# Patient Record
Sex: Male | Born: 2000
Health system: Southern US, Community
[De-identification: ages and names within clinical notes are randomized; demographics above are authoritative.]

## PROBLEM LIST (undated history)

## (undated) DIAGNOSIS — F411 Generalized anxiety disorder: Secondary | ICD-10-CM

## (undated) DIAGNOSIS — F4010 Social phobia, unspecified: Secondary | ICD-10-CM

## (undated) DIAGNOSIS — F419 Anxiety disorder, unspecified: Secondary | ICD-10-CM

## (undated) DIAGNOSIS — F329 Major depressive disorder, single episode, unspecified: Secondary | ICD-10-CM

## (undated) DIAGNOSIS — F32A Depression, unspecified: Secondary | ICD-10-CM

## (undated) DIAGNOSIS — S42309A Unspecified fracture of shaft of humerus, unspecified arm, initial encounter for closed fracture: Secondary | ICD-10-CM

## (undated) DIAGNOSIS — R45851 Suicidal ideations: Secondary | ICD-10-CM

## (undated) HISTORY — PX: APPENDECTOMY: SHX54

## (undated) HISTORY — DX: Unspecified fracture of shaft of humerus, unspecified arm, initial encounter for closed fracture: S42.309A

---
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
## 2020-01-01 ENCOUNTER — Ambulatory Visit: Payer: BC Managed Care – PPO | Attending: Family Medicine

## 2020-01-01 ENCOUNTER — Other Ambulatory Visit: Payer: Self-pay

## 2020-01-01 DIAGNOSIS — R252 Cramp and spasm: Secondary | ICD-10-CM | POA: Diagnosis present

## 2020-01-01 DIAGNOSIS — M6281 Muscle weakness (generalized): Secondary | ICD-10-CM | POA: Diagnosis present

## 2020-01-01 DIAGNOSIS — M25561 Pain in right knee: Secondary | ICD-10-CM | POA: Insufficient documentation

## 2020-01-01 DIAGNOSIS — M25562 Pain in left knee: Secondary | ICD-10-CM | POA: Diagnosis present

## 2020-01-01 DIAGNOSIS — M25511 Pain in right shoulder: Secondary | ICD-10-CM

## 2020-01-01 DIAGNOSIS — R293 Abnormal posture: Secondary | ICD-10-CM | POA: Diagnosis present

## 2020-01-01 DIAGNOSIS — G8929 Other chronic pain: Secondary | ICD-10-CM

## 2020-01-01 NOTE — Therapy (Signed)
[facility] [address], [address] [place], [place], [address] Phone: [phone]   Fax:  [phone]  Physical Therapy Treatment  Patient Details  Name: Steven Mcguire MRN: [id number] Date of Birth: 09/07/2000 Referring Provider (PT): Lavada Mesi, MD   Encounter Date: 01/01/2020   PT End of Session - 01/01/20 1013    Visit Number 5    Date for PT Re-Evaluation 01/29/20    Authorization Type BCBS    PT Start Time 0931    PT Stop Time 1012    PT Time Calculation (min) 41 min    Activity Tolerance Patient tolerated treatment well;No increased pain    Behavior During Therapy WFL for tasks assessed/performed           Past Medical History:  Diagnosis Date  . Anxiety   . Anxiety, generalized   . Broken arm    right  . Depression   . Social anxiety disorder   . Suicidal ideation     Past Surgical History:  Procedure Laterality Date  . APPENDECTOMY     5th grade  . INGUINAL HERNIA REPAIR  2003  . WISDOM TOOTH EXTRACTION Bilateral 08/2019   all 4 wisdom teeth were extracted    There were no vitals filed for this visit.   Subjective Assessment - 01/01/20 0937    Subjective I was on vacation.  No change in knee pain.  I have been doing hip exercises with resisted bands.  Rt shoulder feels 30% better.    Currently in Pain? Yes    Pain Score 4     Pain Location Knee    Pain Orientation Right;Left    Pain Descriptors / Indicators Sore    Pain Type Chronic pain    Pain Onset More than a month ago    Pain Frequency Intermittent    Aggravating Factors  standing, work on feet    Pain Relieving Factors sitting down,rest    Pain Score 0    Pain Location Shoulder                             [facility] Adult PT Treatment/Exercise - 01/01/20 0001      Knee/Hip Exercises: Standing   SLS on blue pod with overhead ball bounce on wall 2x10 to incorporate overhead use    Rebounder weight shifting 3 ways  without UE support and increased speed x 1 minute 3 ways.  Tandem stance 2x30 seconds bil each    Walking with Sports Cord 4 ways x 10 each: 20#       Knee/Hip Exercises: Supine   Other Supine Knee/Hip Exercises green clamshell pulses x 20 outer range tension      Shoulder Exercises: Standing   Other Standing Exercises circles on wall for proximal stabilizaton 2x10 CW/CCW each      Iontophoresis   Type of Iontophoresis Dexamethasone    Location Rt proximal biceps tendon    Dose 1.0 cc, #4    Time 4 hour                    PT Short Term Goals - 01/01/20 2025      PT SHORT TERM GOAL #1   Title be independent in initial HEP    Status Achieved      PT SHORT TERM GOAL #2   Title report a 40% reduction in Rt shoulder pain with reaching overhead and behind  his back    Baseline 30% better    Time 4    Period Weeks    Status On-going      PT SHORT TERM GOAL #3   Title report a 30% reduction in knee pain with standing and squatting for work tasks    Baseline no change reported    Time 4    Period Weeks    Status On-going             PT Long Term Goals - 12/04/19 0926      PT LONG TERM GOAL #1   Title The patient will be independent in safe self progression of HEP    Time 8    Period Weeks    Status New    Target Date 01/29/20      PT LONG TERM GOAL #2   Title reduce FOTO to < to = to 21% limitation    Baseline --    Time 8    Period Weeks    Status New    Target Date 01/29/20      PT LONG TERM GOAL #3   Title report a 75% reduction in Rt UE pain with reaching overhead and behing the back    Baseline --    Time 8    Period Weeks    Status New    Target Date 01/29/20      PT LONG TERM GOAL #4   Title report a 75% reduction in bil knee pain with standing and squatting for work tasks    Baseline --    Time 8    Period Weeks    Status New    Target Date 01/29/20      PT LONG TERM GOAL #5   Title perform regular weight training with the Rt UE  without limitation due to pain    Baseline --    Time 8    Period Weeks    Status New    Target Date 01/29/20                 Plan - 01/01/20 0948    Clinical Impression Statement Pt with lapse in treatment since 12/18/19 due to vacation.  Pt reports 30% improvement in Rt shoulder pain since the start of care and no change in bil knee pain.  Pt has been performing bil knee and hip strength at home consistently.  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  Pt required verbal cues for hip alignment with difficulty with neutral alignment due to hip weakness.   He will continue to benefit from skilled progression to improve strength and mechanis.  PT applied ionto patch to Rt bicep tendon as it continues to be tender and is exacerbated carrying trays on shoulder at work.    PT Treatment/Interventions ADLs/Self Care Home Management;Cryotherapy;Electrical Stimulation;Ultrasound;Moist Heat;Iontophoresis 4mg /ml Dexamethasone;Therapeutic activities;Therapeutic exercise;Neuromuscular re-education;Manual techniques;Patient/family education;Dry needling;Taping;Joint Manipulations;Spinal Manipulations    PT Next Visit Plan f/u on updates to LE HEP, knee and glut strength, ionto Rt shoulder, proximal shoulder stab    PT Home Exercise Plan Access Code: (shoulder program), new program for knee  Access Code: [email or web address]    Consulted and Agree with Plan of Care Patient           Patient will benefit from skilled therapeutic intervention in order to improve the following deficits and impairments:  Decreased range of motion, Increased fascial restricitons, Impaired UE functional use, Pain, Impaired flexibility, Increased muscle spasms, Postural dysfunction, Decreased activity tolerance, Decreased endurance  Visit Diagnosis: Muscle weakness (generalized)  Chronic pain of left knee  Chronic pain  of right knee  Acute pain of right shoulder     Problem List Patient Active Problem List   Diagnosis Date Noted  . Suicidal ideation 08/10/2015  . OCD (obsessive compulsive disorder) 05/07/2015  . Neurotic depression 05/07/2015  . Major depressive disorder, recurrent, severe without psychotic features ([facility])   . MDD (major depressive disorder), recurrent episode, severe ([facility]) 12/24/2014  . School avoidance 09/12/2014  . Abdominal pain in pediatric patient 07/22/2014  . Generalized anxiety disorder 10/05/2013  . Acne 10/05/2013  . Social anxiety disorder 05/06/2013  . Acute appendicitis 11/27/2011     Lorrene Reid, PT 01/01/20 10:14 AM  [facility] [address], [address] [place], [place], [address] Phone: [phone]   Fax:  [phone]  Name: Steven Mcguire MRN: [id number] Date of Birth: 04/06/2001

## 2020-01-06 ENCOUNTER — Ambulatory Visit: Payer: BC Managed Care – PPO | Admitting: Physical Therapy

## 2020-01-06 ENCOUNTER — Other Ambulatory Visit: Payer: Self-pay

## 2020-01-06 ENCOUNTER — Encounter: Payer: Self-pay | Admitting: Physical Therapy

## 2020-01-06 DIAGNOSIS — G8929 Other chronic pain: Secondary | ICD-10-CM

## 2020-01-06 DIAGNOSIS — M6281 Muscle weakness (generalized): Secondary | ICD-10-CM | POA: Diagnosis not present

## 2020-01-06 DIAGNOSIS — M25561 Pain in right knee: Secondary | ICD-10-CM

## 2020-01-06 DIAGNOSIS — M25511 Pain in right shoulder: Secondary | ICD-10-CM

## 2020-01-06 NOTE — Therapy (Signed)
[facility] [address] [place], [place], [address] Phone: [phone]   Fax:  [phone]  Physical Therapy Treatment  Patient Details  Name: Steven Mcguire MRN: [id number] Date of Birth: [birth year] Referring Provider (PT): Lavada Mesi, MD   Encounter Date: 01/06/2020   PT End of Session - 01/06/20 0929    Visit Number 6    Date for PT Re-Evaluation 01/29/20    Authorization Type BCBS    PT Start Time 0930    PT Stop Time 1012    PT Time Calculation (min) 42 min    Activity Tolerance Patient tolerated treatment well;No increased pain    Behavior During Therapy WFL for tasks assessed/performed           Past Medical History:  Diagnosis Date  . Anxiety   . Anxiety, generalized   . Broken arm    right  . Depression   . Social anxiety disorder   . Suicidal ideation     Past Surgical History:  Procedure Laterality Date  . APPENDECTOMY     5th grade  . INGUINAL HERNIA REPAIR  2003  . WISDOM TOOTH EXTRACTION Bilateral 08/2019   all 4 wisdom teeth were extracted    There were no vitals filed for this visit.   Subjective Assessment - 01/06/20 0930    Subjective Occassional twinges in Rt anterior shoulder.  Knees are about the same.    Pertinent History Lt pec pain- treated at this clinic.    Diagnostic tests none    Patient Stated Goals return to LE weight lifting, reduce knee pain, reduce Rt shoulder pain    Currently in Pain? 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 to simulate tray with scap isometrics and alignment practice, add deep squat to simulate lower tray with mirror for feedback on knee alignment      Knee/Hip Exercises: Programme researcher, broadcasting/film/video Right;Left;1 rep;60 seconds    Active Hamstring Stretch Limitations supine    Lobbyist Left;Right;1 rep;60 seconds    Lobbyist Limitations in SL      Knee/Hip Exercises: Machines for Strengthening   Cybex Leg Press 100lb bil LEs PT cued "train track" alignment for knees to avoid valgus      Knee/Hip Exercises: Standing   Functional Squat 15 reps    Functional Squat Limitations holding 10lb with blue hip abd band, bil LEs on foam black pad      Knee/Hip Exercises: Supine   Bridges Limitations hamstring curls bil feet on green ball in bridge 2x10      Shoulder Exercises: Prone   Other Prone Exercises bil I, T, Y 1x10 each      Shoulder Exercises: Standing   External Rotation Strengthening;Right;Theraband;15 reps    External Rotation  Limitations in 90 deg horiz abd    Other Standing Exercises low mat table plank UE walks down/back x 2 reps      Shoulder Exercises: ROM/Strengthening   UBE (Upper Arm Bike) L2 x 6 (3/3)min forward,backward with PT present to discuss progress     Lat Pull Limitations 35lb 2x15                    PT Short Term Goals - 01/01/20 0937      PT SHORT TERM GOAL #1   Title be independent in initial HEP    Status Achieved      PT SHORT TERM GOAL #2   Title report a 40% reduction in Rt shoulder pain with reaching overhead and behind his back    Baseline 30% better    Time 4    Period Weeks    Status On-going      PT SHORT TERM GOAL #3   Title report a 30% reduction in knee pain with standing and squatting for work tasks    Baseline no change reported    Time 4    Period Weeks    Status On-going             PT Long Term Goals - 12/04/19 0926      PT LONG TERM GOAL #1   Title The patient will  be independent in safe self progression of HEP    Time 8    Period Weeks    Status New    Target Date 01/29/20      PT LONG TERM GOAL #2   Title reduce FOTO to < to = to 21% limitation    Baseline --    Time 8    Period Weeks    Status New    Target Date 01/29/20      PT LONG TERM GOAL #3   Title report a 75% reduction in Rt UE pain with reaching overhead and behing the back    Baseline --    Time 8    Period Weeks    Status New    Target Date 01/29/20      PT LONG TERM GOAL #4   Title report a 75% reduction in bil knee pain with standing and squatting for work tasks    Baseline --    Time 8    Period Weeks    Status New    Target Date 01/29/20      PT LONG TERM GOAL #5   Title perform regular weight training with the Rt UE without limitation due to pain    Baseline --    Time 8    Period Weeks    Status New    Target Date 01/29/20                 Plan - 01/06/20 0949    Clinical Impression Statement Pt continues to have some bil knee pain with extended time on feet for work.  He arrived without knee pain today even after having worked all weekend.  He is compliant with LE HEP.  PT had Pt demo work simulation and provided Adult nurse for feedback for knee alignment in deep squat and use of scapular stabilizers for tray on Rt shoulder.  During ther ex Pt needed intermittent cueing for knee alignement by way of hip ER/abd activation.  He continues to have flexibility restrictions bil LEs so PT reiterated importance of stretching to  compliment strength focus.  Rt shoulder pain is intermittent and exacerbated with carrying heavy trays on shoulder at work.  He demos excellent scapular control but needs ongoing endurance and strength to support demands at work.  He will benefit from continued focus on strength and flexibility with transition to HEP over time as he becomes more confident and needs less cueing.    Comorbidities chronic Rt shoulder pain, anxiety    Rehab  Potential Good    PT Frequency 2x / week    PT Duration 8 weeks    PT Treatment/Interventions ADLs/Self Care Home Management;Cryotherapy;Electrical Stimulation;Ultrasound;Moist Heat;Iontophoresis 4mg /ml Dexamethasone;Therapeutic activities;Therapeutic exercise;Neuromuscular re-education;Manual techniques;Patient/family education;Dry needling;Taping;Joint Manipulations;Spinal Manipulations    PT Next Visit Plan f/u on updates to LE HEP, knee and glut strength, ionto Rt shoulder, proximal shoulder stab    PT Home Exercise Plan Access Code: (shoulder program), new program for knee  Access Code: [email or web address]    Consulted and Agree with Plan of Care Patient           Patient will benefit from skilled therapeutic intervention in order to improve the following deficits and impairments:     Visit Diagnosis: Muscle weakness (generalized)  Chronic pain of left knee  Chronic pain of right knee  Acute pain of right shoulder     Problem List Patient Active Problem List   Diagnosis Date Noted  . Suicidal ideation 08/10/2015  . OCD (obsessive compulsive disorder) 05/07/2015  . Neurotic depression 05/07/2015  . Major depressive disorder, recurrent, severe without psychotic features (HCC)   . MDD (major depressive disorder), recurrent episode, severe (HCC) 12/24/2014  . School avoidance 09/12/2014  . Abdominal pain in pediatric patient 07/22/2014  . Generalized anxiety disorder 10/05/2013  . Acne 10/05/2013  . Social anxiety disorder 05/06/2013  . Acute appendicitis 11/27/2011    01/27/2012, PT 01/06/20 10:14 AM   [facility] [address] [place], [place], [place] Phone: [phone]   Fax:  [phone]  Name: Chimaobi Casebolt MRN: Alveria Apley Date of Birth: 03/02/2001

## 2020-01-08 ENCOUNTER — Ambulatory Visit: Payer: BC Managed Care – PPO

## 2020-01-08 ENCOUNTER — Other Ambulatory Visit: Payer: Self-pay

## 2020-01-08 DIAGNOSIS — G8929 Other chronic pain: Secondary | ICD-10-CM

## 2020-01-08 DIAGNOSIS — M25511 Pain in right shoulder: Secondary | ICD-10-CM

## 2020-01-08 DIAGNOSIS — M6281 Muscle weakness (generalized): Secondary | ICD-10-CM

## 2020-01-08 DIAGNOSIS — M25562 Pain in left knee: Secondary | ICD-10-CM

## 2020-01-08 NOTE — Therapy (Signed)
[facility] [address] [place], [place], [address] Phone: [phone]   Fax:  [phone]  Physical Therapy Treatment  Patient Details  Name: Steven Mcguire MRN: [id number] Date of Birth: 07/11/2000 Referring Provider (PT): Lavada Mesi, MD   Encounter Date: 01/08/2020   PT End of Session - 01/08/20 0929    Visit Number 7    Date for PT Re-Evaluation 01/29/20    Authorization Type BCBS    PT Start Time 0845    PT Stop Time 0927    PT Time Calculation (min) 42 min    Activity Tolerance Patient tolerated treatment well;No increased pain    Behavior During Therapy WFL for tasks assessed/performed           Past Medical History:  Diagnosis Date   Anxiety    Anxiety, generalized    Broken arm    right   Depression    Social anxiety disorder    Suicidal ideation     Past Surgical History:  Procedure Laterality Date   APPENDECTOMY     5th grade   INGUINAL HERNIA REPAIR  2003   WISDOM TOOTH EXTRACTION Bilateral 08/2019   all 4 wisdom teeth were extracted    There were no vitals filed for this visit.   Subjective Assessment - 01/08/20 0850    Subjective Not feeling pain right now.  Pain can increase to 6/10 at the end of the day.  Rt shoulder is feeling better.    Currently in Pain? No/denies                             [facility] Adult PT Treatment/Exercise - 01/08/20 0001      Knee/Hip Exercises: Stretches   Active Hamstring Stretch Right;Left;1 rep;60 seconds    Active Hamstring Stretch Limitations on step    Hip Flexor Stretch Left;Right;2 reps;20 seconds      Knee/Hip Exercises: Aerobic   Recumbent Bike Level 2x 8 minutes    PT present to discuss progress     Knee/Hip Exercises: Machines for Strengthening   Cybex Leg Press 100# bil LEs PT cued "train track" alignment for knees to avoid valgus, 60# Rt and Lt  2x10 each      Knee/Hip Exercises: Standing    Functional Squat 15 reps    Functional Squat Limitations holding 10lb with blue hip abd band, bil LEs on foam black pad      Knee/Hip Exercises: Supine   Bridges Limitations hamstring curls bil feet on green ball in bridge 2x10      Shoulder Exercises: Prone   Other Prone Exercises plank with hands on flat surface of Bocu 3x30 seconds      Shoulder Exercises: Standing   External Rotation Strengthening;Right;Theraband;15 reps    Theraband Level (Shoulder External Rotation) Level 4 (Blue)      Shoulder Exercises: ROM/Strengthening   UBE (Upper Arm Bike) --    Lat Pull Limitations 35lb 2x15                    PT Short Term Goals - 01/08/20 0263      PT SHORT TERM GOAL #2   Title report a 40% reduction in Rt shoulder pain with reaching overhead and behind his back    Baseline 35%    Time 4    Period Weeks    Status On-going      PT SHORT TERM  GOAL #3   Title report a 30% reduction in knee pain with standing and squatting for work tasks    Baseline no change reported    Time 4    Period Weeks    Status On-going             PT Long Term Goals - 12/04/19 0926      PT LONG TERM GOAL #1   Title The patient will be independent in safe self progression of HEP    Time 8    Period Weeks    Status New    Target Date 01/29/20      PT LONG TERM GOAL #2   Title reduce FOTO to < to = to 21% limitation    Baseline --    Time 8    Period Weeks    Status New    Target Date 01/29/20      PT LONG TERM GOAL #3   Title report a 75% reduction in Rt UE pain with reaching overhead and behing the back    Baseline --    Time 8    Period Weeks    Status New    Target Date 01/29/20      PT LONG TERM GOAL #4   Title report a 75% reduction in bil knee pain with standing and squatting for work tasks    Baseline --    Time 8    Period Weeks    Status New    Target Date 01/29/20      PT LONG TERM GOAL #5   Title perform regular weight training with the Rt UE without  limitation due to pain    Baseline --    Time 8    Period Weeks    Status New    Target Date 01/29/20                 Plan - 01/08/20 0902    Clinical Impression Statement Pt reports reduced Rt shoulder pain overall by 35% since the start of care.  Pt without knee pain this morning and reports that pain increases to 6/10 at the end of the day.   He is compliant with LE HEP.  During clinic exercise, pt needed intermittent cueing for knee alignment to maintain neutral hip alignment. Rt shoulder pain is intermittent and exacerbated with carrying heavy trays on shoulder at work.  He will benefit from continued focus on strength and flexibility with transition to HEP over time as he becomes more confident and needs less cueing.    PT Treatment/Interventions ADLs/Self Care Home Management;Cryotherapy;Electrical Stimulation;Ultrasound;Moist Heat;Iontophoresis 4mg /ml Dexamethasone;Therapeutic activities;Therapeutic exercise;Neuromuscular re-education;Manual techniques;Patient/family education;Dry needling;Taping;Joint Manipulations;Spinal Manipulations    PT Next Visit Plan work on knee/hip strength and neutral alignment, knee and glut strength, ionto Rt shoulder, proximal shoulder stab    PT Home Exercise Plan Access Code: (shoulder program), new program for knee  Access Code: [email or web address]    Consulted and Agree with Plan of Care Patient           Patient will benefit from skilled therapeutic intervention in order to improve the following deficits and impairments:  Decreased range of motion, Increased fascial restricitons, Impaired UE functional use, Pain, Impaired flexibility, Increased muscle spasms, Postural dysfunction, Decreased activity tolerance, Decreased endurance  Visit Diagnosis: Muscle weakness (generalized)  Chronic pain of left knee  Chronic pain of right knee  Acute pain of right shoulder     Problem List Patient Active Problem List  Diagnosis Date Noted    Suicidal ideation 08/10/2015   OCD (obsessive compulsive disorder) 05/07/2015   Neurotic depression 05/07/2015   Major depressive disorder, recurrent, severe without psychotic features (HCC)    MDD (major depressive disorder), recurrent episode, severe (HCC) 12/24/2014   School avoidance 09/12/2014   Abdominal pain in pediatric patient 07/22/2014   Generalized anxiety disorder 10/05/2013   Acne 10/05/2013   Social anxiety disorder 05/06/2013   Acute appendicitis 11/27/2011     Lorrene Reid, PT 01/08/20 9:32 AM  [facility] [address] [place], [place], [address] Phone: [phone]   Fax:  [phone]  Name: Steven Mcguire MRN: [id number] Date of Birth: 02/08/2001

## 2020-01-13 ENCOUNTER — Ambulatory Visit: Payer: BC Managed Care – PPO

## 2020-01-15 ENCOUNTER — Other Ambulatory Visit: Payer: Self-pay

## 2020-01-15 ENCOUNTER — Ambulatory Visit: Payer: BC Managed Care – PPO

## 2020-01-15 DIAGNOSIS — M6281 Muscle weakness (generalized): Secondary | ICD-10-CM

## 2020-01-15 DIAGNOSIS — M25511 Pain in right shoulder: Secondary | ICD-10-CM

## 2020-01-15 DIAGNOSIS — G8929 Other chronic pain: Secondary | ICD-10-CM

## 2020-01-15 DIAGNOSIS — M25562 Pain in left knee: Secondary | ICD-10-CM

## 2020-01-15 DIAGNOSIS — R252 Cramp and spasm: Secondary | ICD-10-CM

## 2020-01-15 NOTE — Therapy (Signed)
[facility] [address] [place], [place], [address] Phone: [phone]   Fax:  [phone]  Physical Therapy Treatment  Patient Details  Name: Steven Mcguire MRN: [id number] Date of Birth: 05/14/2001 Referring Provider (PT): Lavada Mesi, MD   Encounter Date: 01/15/2020   PT End of Session - 01/15/20 0933    Visit Number 8    Date for PT Re-Evaluation 01/29/20    Authorization Type BCBS    PT Start Time 0847    PT Stop Time 0931    PT Time Calculation (min) 44 min    Activity Tolerance Patient tolerated treatment well    Behavior During Therapy [facility] for tasks assessed/performed           Past Medical History:  Diagnosis Date  . Anxiety   . Anxiety, generalized   . Broken arm    right  . Depression   . Social anxiety disorder   . Suicidal ideation     Past Surgical History:  Procedure Laterality Date  . APPENDECTOMY     5th grade  . INGUINAL HERNIA REPAIR  2003  . WISDOM TOOTH EXTRACTION Bilateral 08/2019   all 4 wisdom teeth were extracted    There were no vitals filed for this visit.   Subjective Assessment - 01/15/20 0849    Subjective Rt shoulder doing well.  No change in knee pain.    Patient Stated Goals return to LE weight lifting, reduce knee pain, reduce Rt shoulder pain    Currently in Pain? No/denies                             [facility] Adult PT Treatment/Exercise - 01/15/20 0001      Knee/Hip Exercises: Stretches   Active Hamstring Stretch Right;Left;1 rep;60 seconds    Active Hamstring Stretch Limitations seated      Knee/Hip Exercises: Aerobic   Recumbent Bike Level 4 x 8 minutes    PT present to discuss progress     Knee/Hip Exercises: Machines for Strengthening   Cybex Leg Press 100# bil LEs PT cued "train track" alignment for knees to avoid valgus, 60# Rt and Lt  2x10 each      Knee/Hip Exercises: Standing   Functional Squat 2 sets;10 reps     Functional Squat Limitations holding 10lb with blue hip abd band (yellow loop) and ball squeeze, bil LEs on foam black pad   2x10 each position   Walking with Sports Cord 25# 4 ways x 10 reps each   verbal cues for alignment of hips and knees      Shoulder Exercises: Prone   Other Prone Exercises plank with hands on flat surface of Bocu 3x30 seconds                    PT Short Term Goals - 01/08/20 1324      PT SHORT TERM GOAL #2   Title report a 40% reduction in Rt shoulder pain with reaching overhead and behind his back    Baseline 35%    Time 4    Period Weeks    Status On-going      PT SHORT TERM GOAL #3   Title report a 30% reduction in knee pain with standing and squatting for work tasks    Baseline no change reported    Time 4    Period Weeks    Status On-going  PT Long Term Goals - 12/04/19 0926      PT LONG TERM GOAL #1   Title The patient will be independent in safe self progression of HEP    Time 8    Period Weeks    Status New    Target Date 01/29/20      PT LONG TERM GOAL #2   Title reduce FOTO to < to = to 21% limitation    Baseline --    Time 8    Period Weeks    Status New    Target Date 01/29/20      PT LONG TERM GOAL #3   Title report a 75% reduction in Rt UE pain with reaching overhead and behing the back    Baseline --    Time 8    Period Weeks    Status New    Target Date 01/29/20      PT LONG TERM GOAL #4   Title report a 75% reduction in bil knee pain with standing and squatting for work tasks    Baseline --    Time 8    Period Weeks    Status New    Target Date 01/29/20      PT LONG TERM GOAL #5   Title perform regular weight training with the Rt UE without limitation due to pain    Baseline --    Time 8    Period Weeks    Status New    Target Date 01/29/20                 Plan - 01/15/20 0906    Clinical Impression Statement Pt reports reduced Rt shoulder pain overall by 40-45% since the  start of care.  Pt denies any limitation in use of Rt UE weight training and has not tried reaching overhead lately.  Pt without knee pain this morning and reports that pain increases to 6/10 at the end of the day.   He is compliant with LE HEP.  During clinic exercise, pt needed intermittent cueing for knee alignment to maintain neutral hip alignment. Rt shoulder pain is intermittent and exacerbated with being on his feet for long periods at work paired with lifting and squatting.  He will benefit from continued focus on strength and flexibility with transition to HEP over time as he becomes more confident and needs less cueing.    PT Frequency 2x / week    PT Duration 8 weeks    PT Treatment/Interventions ADLs/Self Care Home Management;Cryotherapy;Electrical Stimulation;Ultrasound;Moist Heat;Iontophoresis 4mg /ml Dexamethasone;Therapeutic activities;Therapeutic exercise;Neuromuscular re-education;Manual techniques;Patient/family education;Dry needling;Taping;Joint Manipulations;Spinal Manipulations    PT Next Visit Plan work on knee/hip strength and neutral alignment, knee and glut strength, ionto Rt shoulder as needed,  proximal shoulder stab.  Pt is going to call the MD to discuss Lt knee pain.  ERO needed before 8/4.  Possible extension to address functional hip and knee deficits.    PT Home Exercise Plan Access Code: [email or web address] (shoulder program), new program for knee  Access Code: [email or web address]    Consulted and Agree with Plan of Care Patient           Patient will benefit from skilled therapeutic intervention in order to improve the following deficits and impairments:  Decreased range of motion, Increased fascial restricitons, Impaired UE functional use, Pain, Impaired flexibility, Increased muscle spasms, Postural dysfunction, Decreased activity tolerance, Decreased endurance  Visit Diagnosis: Muscle weakness (generalized)  Chronic pain of left knee  Chronic pain  of right knee  Acute pain of  right shoulder  Cramp and spasm     Problem List Patient Active Problem List   Diagnosis Date Noted  . Suicidal ideation 08/10/2015  . OCD (obsessive compulsive disorder) 05/07/2015  . Neurotic depression 05/07/2015  . Major depressive disorder, recurrent, severe without psychotic features (HCC)   . MDD (major depressive disorder), recurrent episode, severe (HCC) 12/24/2014  . School avoidance 09/12/2014  . Abdominal pain in pediatric patient 07/22/2014  . Generalized anxiety disorder 10/05/2013  . Acne 10/05/2013  . Social anxiety disorder 05/06/2013  . Acute appendicitis 11/27/2011    Lorrene Reid, PT 01/15/20 9:34 AM  [facility] [address] [place], [place], [address] Phone: [phone]   Fax:  [phone]  Name: Steven Mcguire MRN: [id number] Date of Birth: 04/15/2001

## 2020-01-20 ENCOUNTER — Ambulatory Visit: Payer: BC Managed Care – PPO | Admitting: Physical Therapy

## 2020-01-20 ENCOUNTER — Encounter: Payer: Self-pay | Admitting: Physical Therapy

## 2020-01-20 ENCOUNTER — Other Ambulatory Visit: Payer: Self-pay

## 2020-01-20 DIAGNOSIS — M6281 Muscle weakness (generalized): Secondary | ICD-10-CM

## 2020-01-20 DIAGNOSIS — M25511 Pain in right shoulder: Secondary | ICD-10-CM

## 2020-01-20 DIAGNOSIS — R293 Abnormal posture: Secondary | ICD-10-CM

## 2020-01-20 DIAGNOSIS — G8929 Other chronic pain: Secondary | ICD-10-CM

## 2020-01-20 NOTE — Therapy (Signed)
[facility] [address] [place], [place], [address] Phone: [phone]   Fax:  [phone]  Physical Therapy Treatment  Patient Details  Name: Steven Mcguire MRN: [id number] Date of Birth: 12/11/2000 Referring Provider (PT): Lavada Mesi, MD   Encounter Date: 01/20/2020   PT End of Session - 01/20/20 0801    Visit Number 9    Date for PT Re-Evaluation 01/29/20    Authorization Type BCBS    PT Start Time 0800    PT Stop Time 0842    PT Time Calculation (min) 42 min    Activity Tolerance Patient tolerated treatment well    Behavior During Therapy [facility] for tasks assessed/performed           Past Medical History:  Diagnosis Date  . Anxiety   . Anxiety, generalized   . Broken arm    right  . Depression   . Social anxiety disorder   . Suicidal ideation     Past Surgical History:  Procedure Laterality Date  . APPENDECTOMY     5th grade  . INGUINAL HERNIA REPAIR  2003  . WISDOM TOOTH EXTRACTION Bilateral 08/2019   all 4 wisdom teeth were extracted    There were no vitals filed for this visit.   Subjective Assessment - 01/20/20 0805    Subjective I am using the improved techniques for carrying the trays and squatting in lunge with better knee position and it seems to help my pain at work.    Pertinent History Lt pec pain- treated at this clinic.    Patient Stated Goals return to LE weight lifting, reduce knee pain, reduce Rt shoulder pain    Currently in Pain? 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    Multiple Pain Sites No                             [facility] Adult PT Treatment/Exercise - 01/20/20 0001      Self-Care    Self-Care Other Self-Care Comments    Other Self-Care Comments  self-rolling of quads and ITB seated with roller (rolling pin for home)      Exercises   Exercises Knee/Hip;Shoulder      Lumbar Exercises: Aerobic   Recumbent Bike L3 x 6' PT present to discuss progress and symptoms      Knee/Hip Exercises: Stretches   Active Hamstring Stretch Right;Left;1 rep;60 seconds    Active Hamstring Stretch Limitations supine    Quad Stretch Both;2 reps;30 seconds    Quad Stretch Limitations prone    Other Knee/Hip Stretches adductor stretch bil seated V lean forward and side to side 1x20 each      Knee/Hip Exercises: Sidelying   Hip ABduction Strengthening;Both;15 reps;1 set    Clams blue resistance band x 20 each      Shoulder Exercises: ROM/Strengthening   Lat Pull Limitations 1x15 each at 30lb, 35lb    Other ROM/Strengthening Exercises standing series Rt UE with 5lb dumbbell: shoulder abd to 90 elbow bent, then ER, then overhead press, 1x10 (HEP)  PT Short Term Goals - 01/08/20 5643      PT SHORT TERM GOAL #2   Title report a 40% reduction in Rt shoulder pain with reaching overhead and behind his back    Baseline 35%    Time 4    Period Weeks    Status On-going      PT SHORT TERM GOAL #3   Title report a 30% reduction in knee pain with standing and squatting for work tasks    Baseline no change reported    Time 4    Period Weeks    Status On-going             PT Long Term Goals - 12/04/19 0926      PT LONG TERM GOAL #1   Title The patient will be independent in safe self progression of HEP    Time 8    Period Weeks    Status New    Target Date 01/29/20      PT LONG TERM GOAL #2   Title reduce FOTO to < to = to 21% limitation    Baseline --    Time 8    Period Weeks    Status New    Target Date 01/29/20      PT LONG TERM GOAL #3   Title report a 75% reduction in Rt UE pain with reaching overhead and behing the back    Baseline --     Time 8    Period Weeks    Status New    Target Date 01/29/20      PT LONG TERM GOAL #4   Title report a 75% reduction in bil knee pain with standing and squatting for work tasks    Baseline --    Time 8    Period Weeks    Status New    Target Date 01/29/20      PT LONG TERM GOAL #5   Title perform regular weight training with the Rt UE without limitation due to pain    Baseline --    Time 8    Period Weeks    Status New    Target Date 01/29/20                 Plan - 01/20/20 3295    Clinical Impression Statement Pt is concentrating on using improved dynamic alignment and muscle recruitment during work tasks which he reports is helping his pain at work.  He continues to have weakness in Rt shoulder girdle and bil hips as well as flexiblity restrictions in LEs.  Pt is compliant with HEP.  PT progressed HEP for Rt shoulder and bil knees to include functional strength, flexibility and self-massage with roller (quads and ITB) to improve body mechanics and work demands.  PT discussed liklihood of d/c of shoulder to HEP next visit with option to renew POC for bil LE strength and flexibility progression.    Comorbidities chronic Rt shoulder pain, anxiety    Rehab Potential Good    PT Frequency 2x / week    PT Duration 8 weeks    PT Treatment/Interventions ADLs/Self Care Home Management;Cryotherapy;Electrical Stimulation;Ultrasound;Moist Heat;Iontophoresis 4mg /ml Dexamethasone;Therapeutic activities;Therapeutic exercise;Neuromuscular re-education;Manual techniques;Patient/family education;Dry needling;Taping;Joint Manipulations;Spinal Manipulations    PT Next Visit Plan f/u on updates to UE and LE HEP last visit, ERO with likely d/c shoulder extension of LEs    PT Home Exercise Plan Access Code: (shoulder program), new program for knee  Access Code:  [email or web address]    Consulted and Agree with Plan of Care Patient           Patient will benefit from skilled therapeutic  intervention in order to improve the following deficits and impairments:     Visit Diagnosis: Muscle weakness (generalized)  Chronic pain of left knee  Chronic pain of right knee  Acute pain of right shoulder  Abnormal posture     Problem List Patient Active Problem List   Diagnosis Date Noted  . Suicidal ideation 08/10/2015  . OCD (obsessive compulsive disorder) 05/07/2015  . Neurotic depression 05/07/2015  . Major depressive disorder, recurrent, severe without psychotic features (HCC)   . MDD (major depressive disorder), recurrent episode, severe (HCC) 12/24/2014  . School avoidance 09/12/2014  . Abdominal pain in pediatric patient 07/22/2014  . Generalized anxiety disorder 10/05/2013  . Acne 10/05/2013  . Social anxiety disorder 05/06/2013  . Acute appendicitis 11/27/2011    Morton Peters, PT 01/20/20 8:45 AM    [facility] [address], [address] [place], [place], [address] Phone: [phone]   Fax:  [phone]  Name: Steven Mcguire MRN: [id number] Date of Birth: Sep 25, 2000

## 2020-01-29 ENCOUNTER — Ambulatory Visit: Payer: BC Managed Care – PPO | Admitting: Physical Therapy

## 2020-02-05 ENCOUNTER — Other Ambulatory Visit: Payer: Self-pay

## 2020-02-05 ENCOUNTER — Encounter: Payer: Self-pay | Admitting: Physical Therapy

## 2020-02-05 ENCOUNTER — Ambulatory Visit: Payer: BC Managed Care – PPO | Attending: Family Medicine | Admitting: Physical Therapy

## 2020-02-05 DIAGNOSIS — M6281 Muscle weakness (generalized): Secondary | ICD-10-CM | POA: Diagnosis not present

## 2020-02-05 DIAGNOSIS — R252 Cramp and spasm: Secondary | ICD-10-CM | POA: Diagnosis present

## 2020-02-05 DIAGNOSIS — G8929 Other chronic pain: Secondary | ICD-10-CM | POA: Diagnosis present

## 2020-02-05 DIAGNOSIS — M25561 Pain in right knee: Secondary | ICD-10-CM | POA: Diagnosis present

## 2020-02-05 DIAGNOSIS — M25562 Pain in left knee: Secondary | ICD-10-CM | POA: Diagnosis present

## 2020-02-05 NOTE — Therapy (Addendum)
[facility] [address] [place], [place], [address] Phone: [phone]   Fax:  [phone]  Physical Therapy Treatment  Patient Details  Name: Steven Mcguire MRN: [id number] Date of Birth: 2001-03-08 Referring Provider (PT): Eunice Blase, MD   Encounter Date: 02/05/2020    Past Medical History:  Diagnosis Date  . Anxiety   . Anxiety, generalized   . Broken arm    right  . Depression   . Social anxiety disorder   . Suicidal ideation     Past Surgical History:  Procedure Laterality Date  . APPENDECTOMY     5th grade  . INGUINAL HERNIA REPAIR  2003  . WISDOM TOOTH EXTRACTION Bilateral 08/2019   all 4 wisdom teeth were extracted    There were no vitals filed for this visit.   Subjective Assessment - 02/05/20 0936    Subjective My knees were really hurting yesterday while working.  I am using all the strategies we've talked about but the pain is still there.  My shoulder is much better.  Carrying trays is better.    Pertinent History Lt pec pain- treated at this clinic.    Diagnostic tests none    Patient Stated Goals return to LE weight lifting, reduce knee pain, reduce Rt shoulder pain    Currently in Pain? Yes    Pain Score 5     Pain Location Knee    Pain Orientation Right;Left;Anterior;Medial    Pain Descriptors / Indicators Aching;Sore    Pain Type Chronic pain    Pain Onset More than a month ago    Pain Frequency Intermittent    Aggravating Factors  work    Pain Relieving Factors rest    Effect of Pain on Daily Activities pain with work              [facility] PT Assessment - 02/05/20 0001      Assessment   Medical Diagnosis Rt knee pain, Lt knee pain, acute pain of the Rt shoulder    Referring Provider (PT) Hilts, Michael, MD    Onset Date/Surgical Date 10/04/19    Hand Dominance Right    Next MD Visit none    Prior Therapy for shoulder pain      Functional Tests   Functional tests  Squat      Squat   Comments Pt needs increased focus to control bil knee valgus      Posture/Postural Control   Posture Comments improved upper body postural awareness for head/neck/shoulders      ROM / Strength   AROM / PROM / Strength AROM      AROM   Overall AROM Comments bil knees WNL, no pain      Strength   Overall Strength Within functional limits for tasks performed    Strength Assessment Site Hip   needs more strength in bil hip abd/ER for work demands   Right/Left Hip Right;Left    Right Hip External Rotation  4+/5    Right Hip ABduction 4+/5    Left Hip External Rotation 4+/5    Left Hip ABduction 4+/5      Flexibility   Soft Tissue Assessment /Muscle Length yes   hip flexors end range limitation   Hamstrings limited 50% bil    Quadriceps end range limited bil    ITB limited via [employer] position on Lt only  [place] [place] Adult PT Treatment/Exercise - 02/05/20 0001      Knee/Hip Exercises: Stretches   Active Hamstring Stretch Left;Right;2 reps;30 seconds    Active Hamstring Stretch Limitations foot on 2nd step    Sports administrator Both;2 reps;20 seconds    Quad Stretch Limitations standing    Hip Flexor Stretch Both;1 rep;30 seconds    Hip Flexor Stretch Limitations foot on 2nd step with overhead reach with SB      Knee/Hip Exercises: Aerobic   Recumbent Bike L3x5 min, PT present to discuss symptoms and review goals      Manual Therapy   Manual Therapy Soft tissue mobilization    Manual therapy comments deep tissue release and TP release along entire length of ITB on Lt, TFL stripping    Soft tissue mobilization Addaday white and blue head attachments ITB and TFL on Lt                    PT Short Term Goals - 02/05/20 1134      PT SHORT TERM GOAL #1   Title be independent in initial HEP    Status Achieved      PT SHORT TERM GOAL #2   Title report a 40% reduction in Rt shoulder pain with reaching overhead and  behind his back    Status Achieved      PT SHORT TERM GOAL #3   Title report a 30% reduction in knee pain with standing and squatting for work tasks    Status Partially Met             PT Long Term Goals - 02/05/20 1124      PT LONG TERM GOAL #1   Title The patient will be independent in safe self progression of HEP    Baseline ind for shoulders, needs finalization of LE HEP    Status On-going      PT LONG TERM GOAL #2   Title reduce FOTO to < to = to 21% limitation    Status On-going      PT LONG TERM GOAL #3   Title report a 75% reduction in Rt UE pain with reaching overhead and behing the back    Status Achieved      PT LONG TERM GOAL #4   Title report a 75% reduction in bil knee pain with standing and squatting for work tasks    Status On-going      PT LONG TERM GOAL #5   Title perform regular weight training with the Rt UE without limitation due to pain    Status Achieved                 Plan - 02/05/20 1128    Clinical Impression Statement Pt is ready to d/c shoulder but needs several visits to finalize HEP for LEs.  He is having less incidence of pain in Rt shoulder and is more aware of proximal strength for carrying trays.  He continues to be discouarged by bil knee pain (ant/med) with work demands requiring him to perform repeated deep squats with trays on shoulder.  He has weakness in lateral hips and tendency toward valgus in bil knees.  He is working on hip strength via HEP and focusing on body mechanics at work for improved knee alignment.  He has signif flexibility restrictions throughout bil LEs which he is working on as well.  PT noted signif ITB/TFL restrictions on Lt with TPs along length and performed deep  tissue techniques to addres this.  Pt will add focused rolling to release this at home.  PT and Pt discussed that there is still room for him to improve flexibility and strength to see if this helps his knee pain, but that if not he may follow back up  with referring MD.  PT advises one more PT session and d/c to HEP at that point.    Comorbidities chronic Rt shoulder pain, anxiety    Stability/Clinical Decision Making Stable/Uncomplicated    Clinical Decision Making Low    Rehab Potential Good    PT Frequency 1x / week    PT Duration 3 weeks    PT Treatment/Interventions ADLs/Self Care Home Management;Cryotherapy;Electrical Stimulation;Ultrasound;Moist Heat;Iontophoresis 5m/ml Dexamethasone;Therapeutic activities;Therapeutic exercise;Neuromuscular re-education;Manual techniques;Patient/family education;Dry needling;Taping;Joint Manipulations;Spinal Manipulations    PT Next Visit Plan finalize HEP for lateral hip strength (abd/ER) and LE flexibility (hamstrings, adductors, quads), STM Lt ITB/TFL, d/c next visit    PT Home Exercise Plan Access Code: [email or web address](shoulder program), new program for knee  Access Code: [email or web address]    Consulted and Agree with Plan of Care Patient           Patient will benefit from skilled therapeutic intervention in order to improve the following deficits and impairments:  Pain, Impaired flexibility, Increased muscle spasms, Decreased activity tolerance, Decreased endurance, Decreased strength  Visit Diagnosis: Muscle weakness (generalized) - Plan: PT plan of care cert/re-cert  Chronic pain of left knee - Plan: PT plan of care cert/re-cert  Chronic pain of right knee - Plan: PT plan of care cert/re-cert  Cramp and spasm - Plan: PT plan of care cert/re-cert     Problem List Patient Active Problem List   Diagnosis Date Noted  . Suicidal ideation 08/10/2015  . OCD (obsessive compulsive disorder) 05/07/2015  . Neurotic depression 05/07/2015  . Major depressive disorder, recurrent, severe without psychotic features ([place]   . MDD (major depressive disorder), recurrent episode, severe ([place] 12/24/2014  . School avoidance 09/12/2014  . Abdominal pain in pediatric patient 07/22/2014  . Generalized anxiety  disorder 10/05/2013  . Acne 10/05/2013  . Social anxiety disorder 05/06/2013  . Acute appendicitis 11/27/2011    JBaruch Merl PT 02/05/20 11:36 AM  PHYSICAL THERAPY DISCHARGE SUMMARY  Visits from Start of Care: 10  Current functional level related to goals / functional outcomes: Pt missed final appointment due to cancellation.     Remaining deficits: See above   Education / Equipment: HEP Plan: Patient agrees to discharge.  Patient goals were partially met. Patient is being discharged due to meeting the stated rehab goals.  ?????         JBaruch Merl PT 02/14/20 8:24 AM   [facility] [address] [place] [address]: [phone]  Fax:  [phone] Name: CTorez BeauregardMRN: [id number] of Birth: 205/10/2000

## 2020-02-14 ENCOUNTER — Ambulatory Visit: Payer: BC Managed Care – PPO | Admitting: Physical Therapy

## 2020-07-20 ENCOUNTER — Other Ambulatory Visit: Payer: BC Managed Care – PPO

## 2020-07-20 DIAGNOSIS — Z20822 Contact with and (suspected) exposure to covid-19: Secondary | ICD-10-CM

## 2020-07-21 LAB — SARS-COV-2, NAA 2 DAY TAT

## 2020-07-21 LAB — NOVEL CORONAVIRUS, NAA: SARS-CoV-2, NAA: NOT DETECTED

## 2020-10-05 ENCOUNTER — Ambulatory Visit (HOSPITAL_BASED_OUTPATIENT_CLINIC_OR_DEPARTMENT_OTHER): Payer: BC Managed Care – PPO | Attending: Physician Assistant | Admitting: Physical Therapy

## 2020-10-05 ENCOUNTER — Other Ambulatory Visit: Payer: Self-pay

## 2020-10-05 ENCOUNTER — Encounter (HOSPITAL_BASED_OUTPATIENT_CLINIC_OR_DEPARTMENT_OTHER): Payer: Self-pay | Admitting: Physical Therapy

## 2020-10-05 DIAGNOSIS — G8929 Other chronic pain: Secondary | ICD-10-CM | POA: Insufficient documentation

## 2020-10-05 DIAGNOSIS — R262 Difficulty in walking, not elsewhere classified: Secondary | ICD-10-CM | POA: Insufficient documentation

## 2020-10-05 DIAGNOSIS — M6281 Muscle weakness (generalized): Secondary | ICD-10-CM | POA: Diagnosis present

## 2020-10-05 DIAGNOSIS — M25661 Stiffness of right knee, not elsewhere classified: Secondary | ICD-10-CM | POA: Diagnosis present

## 2020-10-05 DIAGNOSIS — R600 Localized edema: Secondary | ICD-10-CM | POA: Insufficient documentation

## 2020-10-05 DIAGNOSIS — M25561 Pain in right knee: Secondary | ICD-10-CM | POA: Diagnosis present

## 2020-10-05 NOTE — Therapy (Signed)
[facility] [address] [place], [place], [address] Phone: [phone]   Fax:  [phone]  Physical Therapy Evaluation  Patient Details  Name: Steven Mcguire MRN: [id number] Date of Birth: 11/18/2000 Referring Provider (PT): [place], [place] Plan D, [place]   Encounter Date: 10/05/2020   PT End of Session - 10/05/20 1240    Visit Number 1    Number of Visits 7    Date for PT Re-Evaluation 11/16/20    Authorization Type BCBS    PT Start Time 1145    PT Stop Time 1230    PT Time Calculation (min) 45 min    Activity Tolerance Patient tolerated treatment well    Behavior During Therapy [facility] for tasks assessed/performed           Past Medical History:  Diagnosis Date  . Anxiety   . Anxiety, generalized   . Broken arm    right  . Depression   . Social anxiety disorder   . Suicidal ideation     Past Surgical History:  Procedure Laterality Date  . APPENDECTOMY     5th grade  . INGUINAL HERNIA REPAIR  2003  . WISDOM TOOTH EXTRACTION Bilateral 08/2019   all 4 wisdom teeth were extracted    There were no vitals filed for this visit.    Subjective Assessment - 10/05/20 1151    Subjective Pt reports ~2 wks ago on monday he had R knee surgery. Pt got plica and fat pad removal. Pt went to ortho PA and they suggested PT. Pt reports difficulty with bending. Pt states it is still stiff with walking.    Limitations Standing;Walking;House hold activities;Lifting    How long can you sit comfortably? n/a    How long can you stand comfortably? ~1 hr ago    How long can you walk comfortably? ~15 to 30 minutes    Patient Stated Goals Improve motion and strength; return to strength training and running    Currently in Pain? Yes    Pain Score 3     Pain Location Knee    Pain Orientation Right    Pain Descriptors / Indicators Tightness    Pain Type Chronic pain    Pain Onset 1 to 4 weeks ago    Pain Frequency Intermittent     Aggravating Factors  Standing, walking    Pain Relieving Factors Ice              [facility] PT Assessment - 10/05/20 0001      Assessment   Medical Diagnosis Z47.89 (ICD-10-CM) - Encounter for other orthopedic aftercare    Referring Provider (PT) Dion Saucier D, PA    Prior Therapy 2021 for his knee      Precautions   Precautions None      Restrictions   Weight Bearing Restrictions No      Balance Screen   Has the patient fallen in the past 6 months No      Home Environment   Living Environment Private residence    Type of Home House    Home Access Stairs to enter    Entrance Stairs-Number of Steps 5    Home Layout Two level    Alternate Level Stairs-Number of Steps 10    Alternate Level Stairs-Rails Right      Prior Function   Level of Independence Independent    Vocation 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[employer]; 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  Observation/Other Assessments   Focus on Therapeutic Outcomes (FOTO)  n/a      Observation/Other Assessments-Edema    Edema Circumferential      Circumferential Edema   Circumferential - Right 35 cm    Circumferential - Left  34 cm      Functional Tests   Functional tests Single leg stance;Squat      Squat   Comments ~1/4 squat, feet abducted, limited due to pain      Single Leg Stance   Comments L LE: 30 sec; R LE: 30 sec;      Posture/Postural Control   Posture Comments R knee valgus when right foot not abducted      ROM / Strength   AROM / PROM / Strength AROM;Strength      AROM   AROM Assessment Site Knee    Right/Left Knee Right    Right Knee Extension -10    Right Knee Flexion 107      Strength   Strength Assessment Site Hip;Knee    Right/Left Hip Right    Right Hip Flexion 4/5    Right Hip Extension 4/5    Right Hip External Rotation  3+/5    Right Hip ABduction 3+/5    Right/Left Knee Right    Right Knee Flexion 4+/5    Right Knee Extension 4+/5      Palpation   Patella  mobility WFL      Special Tests    Special Tests Laxity/Instability Tests    Laxity/Instability  --   WFL; mild pain LCL and MCL     Ambulation/Gait   Ambulation Distance (Feet) 100 Feet    Assistive device None    Gait Pattern Step-through pattern;Decreased dorsiflexion - right;Decreased stance time - left;Antalgic;Wide base of support;Abducted- right    Ambulation Surface Level;Indoor                      Objective measurements completed on examination: See above findings.               PT Education - 10/05/20 1255    Education Details Discussed exam findings, proper knee alignment and tracking, POC and HEP.    Person(s) Educated Patient    Methods Explanation;Verbal cues;Handout    Comprehension Verbalized understanding;Returned demonstration;Need further instruction               PT Long Term Goals - 10/05/20 1247      PT LONG TERM GOAL #1   Title Pt will be independent with HEP    Time 6    Period Weeks    Status New    Target Date 11/16/20      PT LONG TERM GOAL #2   Title Pt will have R = L knee AROM    Baseline R knee 5-110 deg    Time 6    Period Weeks    Status New    Target Date 11/16/20      PT LONG TERM GOAL #3   Title Pt will be able to demo normal reciprocal gait pattern    Baseline antalgic, wide base, R foot abducted, R knee valgus    Time 6    Period Weeks    Status New    Target Date 11/16/20      PT LONG TERM GOAL #4   Title Pt will be able to perform 10 squats with <2/10 pain    Time 6    Period Weeks  Status New    Target Date 11/16/20      PT LONG TERM GOAL #5   Title Pt will understand self progression for return to run and return to strength training protocol    Time 6    Period Weeks    Status New    Target Date 11/16/20                  Plan - 10/05/20 1240    Clinical Impression Statement Pt is a 20 y/o M presenting to OPPT s/p R knee plica and fat pad removal surgery ~2 wks ago. On  assessment, pt demos edema, decreased ROM and strength, pain, and increased hip internal rotation with subsequent increased knee valgus/Q-angle affecting weight bearing activities such as standing and amb. Pt will benefit from PT to improve his general mobility and knee alignment.    Personal Factors and Comorbidities Age;Past/Current Experience;Social Background;Time since onset of injury/illness/exacerbation    Examination-Activity Limitations Squat;Stairs;Stand;Locomotion Level;Transfers    Examination-Participation Restrictions Cleaning;Community Activity;Driving;Occupation;School;Yard Work;Shop    Stability/Clinical Decision Making Stable/Uncomplicated    Clinical Decision Making Low    Rehab Potential Good    PT Frequency 1x / week    PT Duration 6 weeks    PT Treatment/Interventions ADLs/Self Care Home Management;Aquatic Therapy;Canalith Repostioning;Cryotherapy;Electrical Stimulation;Iontophoresis 4mg /ml Dexamethasone;Moist Heat;Ultrasound;Gait training;DME Instruction;Stair training;Functional mobility training;Therapeutic activities;Therapeutic exercise;Balance training;Neuromuscular re-education;Orthotic Fit/Training;Patient/family education;Manual techniques;Dry needling;Passive range of motion;Taping;Vasopneumatic Device    PT Next Visit Plan Assess response to HEP. Continue to strengthen R hip, quad, and hamstring. Continue to work on knee stability and gait training to decrease antalgic gait. Improve knee ROM    PT Home Exercise Plan Access Code: [email or web address]    Consulted and Agree with Plan of Care Patient           Patient will benefit from skilled therapeutic intervention in order to improve the following deficits and impairments:  Abnormal gait,Hypomobility,Increased edema,Decreased activity tolerance,Decreased strength,Increased fascial restricitons,Pain,Decreased balance,Decreased mobility,Difficulty walking,Decreased range of motion,Improper body mechanics  Visit  Diagnosis: Stiffness of right knee, not elsewhere classified  Chronic pain of right knee  Difficulty in walking, not elsewhere classified  Localized edema     Problem List Patient Active Problem List   Diagnosis Date Noted  . Suicidal ideation 08/10/2015  . OCD (obsessive compulsive disorder) 05/07/2015  . Neurotic depression 05/07/2015  . Major depressive disorder, recurrent, severe without psychotic features (HCC)   . MDD (major depressive disorder), recurrent episode, severe (HCC) 12/24/2014  . School avoidance 09/12/2014  . Abdominal pain in pediatric patient 07/22/2014  . Generalized anxiety disorder 10/05/2013  . Acne 10/05/2013  . Social anxiety disorder 05/06/2013  . Acute appendicitis 11/27/2011    [facility] [address] PT, DPT 10/05/2020, 12:56 PM  [facility] [address] [place], [place], [place] Phone: [phone]   Fax:  [phone]  Name: Steven Mcguire MRN: Alveria Apley Date of Birth: 10/11/2000

## 2020-10-12 ENCOUNTER — Other Ambulatory Visit: Payer: Self-pay

## 2020-10-12 ENCOUNTER — Ambulatory Visit (HOSPITAL_BASED_OUTPATIENT_CLINIC_OR_DEPARTMENT_OTHER): Payer: BC Managed Care – PPO | Admitting: Physical Therapy

## 2020-10-12 DIAGNOSIS — G8929 Other chronic pain: Secondary | ICD-10-CM

## 2020-10-12 DIAGNOSIS — M25661 Stiffness of right knee, not elsewhere classified: Secondary | ICD-10-CM | POA: Diagnosis not present

## 2020-10-12 DIAGNOSIS — R262 Difficulty in walking, not elsewhere classified: Secondary | ICD-10-CM

## 2020-10-12 DIAGNOSIS — M25561 Pain in right knee: Secondary | ICD-10-CM

## 2020-10-12 DIAGNOSIS — R600 Localized edema: Secondary | ICD-10-CM

## 2020-10-12 DIAGNOSIS — M6281 Muscle weakness (generalized): Secondary | ICD-10-CM

## 2020-10-12 NOTE — Therapy (Signed)
[facility] [address] [place], [place], [address] Phone: [phone]   Fax:  [phone]  Physical Therapy Treatment  Patient Details  Name: Steven Mcguire MRN: [id number] Date of Birth: 12/30/2000 Referring Provider (PT): [place], [place], [place]   Encounter Date: 10/12/2020   PT End of Session - 10/12/20 1258    Visit Number 2    Number of Visits 7    Date for PT Re-Evaluation 11/16/20    Authorization Type BCBS    PT Start Time 1300    PT Stop Time 1345    PT Time Calculation (min) 45 min    Activity Tolerance Patient tolerated treatment well    Behavior During Therapy [facility] for tasks assessed/performed           Past Medical History:  Diagnosis Date  . Anxiety   . Anxiety, generalized   . Broken arm    right  . Depression   . Social anxiety disorder   . Suicidal ideation     Past Surgical History:  Procedure Laterality Date  . APPENDECTOMY     5th grade  . INGUINAL HERNIA REPAIR  2003  . WISDOM TOOTH EXTRACTION Bilateral 08/2019   all 4 wisdom teeth were extracted    There were no vitals filed for this visit.   Subjective Assessment - 10/12/20 1300    Subjective Pt states he's been working on his knee tracking at home. Pt has been doing exercises every day. No issues. Pt states that knee swelling has been decreasing.    Limitations Standing;Walking;House hold activities;Lifting    How long can you sit comfortably? n/a    How long can you stand comfortably? ~1 hr ago    How long can you walk comfortably? ~15 to 30 minutes    Patient Stated Goals Improve motion and strength; return to strength training and running    Currently in Pain? Yes    Pain Score 4     Pain Location Knee    Pain Orientation Right    Pain Onset 1 to 4 weeks ago    Pain Frequency Intermittent                             [facility] Adult PT Treatment/Exercise - 10/12/20 0001      Ambulation/Gait   Ambulation  Distance (Feet) 600 Feet    Assistive device None    Gait Pattern Step-through pattern;Antalgic;Abducted- right;Decreased stance time - right    Ambulation Surface Level;Indoor    Gait Comments Pain in anterior knee/joint space after ~300'      Exercises   Exercises Knee/Hip      Knee/Hip Exercises: Machines for Strengthening   Other Machine Shuttle 75# 3x10 with green tband;      Knee/Hip Exercises: Seated   Sit to Sand 2 sets;10 reps;without UE support   Eccentrics     Knee/Hip Exercises: Sidelying   Hip ABduction Strengthening;Left;2 sets;10 reps;Right    Other Sidelying Knee/Hip Exercises Side plank 2x30 sec      Knee/Hip Exercises: Prone   Hamstring Curl 2 sets;10 reps   cues to decrease hip IR   Hamstring Curl Limitations green tband      Manual Therapy   Manual Therapy Taping    Kinesiotex Facilitate Muscle;Create Space      Kinesiotix   Create Space knee joint space    Facilitate Muscle  hip abductor  PT Long Term Goals - 10/05/20 1247      PT LONG TERM GOAL #1   Title Pt will be independent with HEP    Time 6    Period Weeks    Status New    Target Date 11/16/20      PT LONG TERM GOAL #2   Title Pt will have R = L knee AROM    Baseline R knee 5-110 deg    Time 6    Period Weeks    Status New    Target Date 11/16/20      PT LONG TERM GOAL #3   Title Pt will be able to demo normal reciprocal gait pattern    Baseline antalgic, wide base, R foot abducted, R knee valgus    Time 6    Period Weeks    Status New    Target Date 11/16/20      PT LONG TERM GOAL #4   Title Pt will be able to perform 10 squats with <2/10 pain    Time 6    Period Weeks    Status New    Target Date 11/16/20      PT LONG TERM GOAL #5   Title Pt will understand self progression for return to run and return to strength training protocol    Time 6    Period Weeks    Status New    Target Date 11/16/20                 Plan -  10/12/20 1321    Clinical Impression Statement Treatment focused on knee and hip strengthening and motor control. Pt with decreasing edema. Continued work and v/cs for improved LE alignment.    Personal Factors and Comorbidities Age;Past/Current Experience;Social Background;Time since onset of injury/illness/exacerbation    Examination-Activity Limitations Squat;Stairs;Stand;Locomotion Level;Transfers    Examination-Participation Restrictions Cleaning;Community Activity;Driving;Occupation;School;Yard Work;Shop    Stability/Clinical Decision Making Stable/Uncomplicated    Rehab Potential Good    PT Frequency 1x / week    PT Duration 6 weeks    PT Treatment/Interventions ADLs/Self Care Home Management;Aquatic Therapy;Canalith Repostioning;Cryotherapy;Electrical Stimulation;Iontophoresis 4mg /ml Dexamethasone;Moist Heat;Ultrasound;Gait training;DME Instruction;Stair training;Functional mobility training;Therapeutic activities;Therapeutic exercise;Balance training;Neuromuscular re-education;Orthotic Fit/Training;Patient/family education;Manual techniques;Dry needling;Passive range of motion;Taping;Vasopneumatic Device    PT Next Visit Plan Assess response to HEP. Continue to strengthen R hip, quad, and hamstring. Continue to work on knee stability and gait training to decrease antalgic gait. Improve knee ROM    PT Home Exercise Plan Access Code: [email or web address]    Consulted and Agree with Plan of Care Patient           Patient will benefit from skilled therapeutic intervention in order to improve the following deficits and impairments:  Abnormal gait,Hypomobility,Increased edema,Decreased activity tolerance,Decreased strength,Increased fascial restricitons,Pain,Decreased balance,Decreased mobility,Difficulty walking,Decreased range of motion,Improper body mechanics  Visit Diagnosis: Stiffness of right knee, not elsewhere classified  Chronic pain of right knee  Difficulty in walking, not elsewhere  classified  Localized edema  Muscle weakness (generalized)     Problem List Patient Active Problem List   Diagnosis Date Noted  . Suicidal ideation 08/10/2015  . OCD (obsessive compulsive disorder) 05/07/2015  . Neurotic depression 05/07/2015  . Major depressive disorder, recurrent, severe without psychotic features (HCC)   . MDD (major depressive disorder), recurrent episode, severe (HCC) 12/24/2014  . School avoidance 09/12/2014  . Abdominal pain in pediatric patient 07/22/2014  . Generalized anxiety disorder 10/05/2013  . Acne 10/05/2013  . Social anxiety disorder 05/06/2013  .  Acute appendicitis 11/27/2011    [facility] [address] PT, DPT 10/12/2020, 1:49 PM  [facility] [address] [place], [place], [address] Phone: [phone]   Fax:  [phone]  Name: Trevon Strothers MRN: [id number] Date of Birth: [birth year]

## 2020-10-21 ENCOUNTER — Other Ambulatory Visit: Payer: Self-pay

## 2020-10-21 ENCOUNTER — Ambulatory Visit (HOSPITAL_BASED_OUTPATIENT_CLINIC_OR_DEPARTMENT_OTHER): Payer: BC Managed Care – PPO | Admitting: Physical Therapy

## 2020-10-21 DIAGNOSIS — G8929 Other chronic pain: Secondary | ICD-10-CM

## 2020-10-21 DIAGNOSIS — R600 Localized edema: Secondary | ICD-10-CM

## 2020-10-21 DIAGNOSIS — M6281 Muscle weakness (generalized): Secondary | ICD-10-CM

## 2020-10-21 DIAGNOSIS — M25561 Pain in right knee: Secondary | ICD-10-CM

## 2020-10-21 DIAGNOSIS — R262 Difficulty in walking, not elsewhere classified: Secondary | ICD-10-CM

## 2020-10-21 DIAGNOSIS — M25661 Stiffness of right knee, not elsewhere classified: Secondary | ICD-10-CM

## 2020-10-22 ENCOUNTER — Encounter (HOSPITAL_BASED_OUTPATIENT_CLINIC_OR_DEPARTMENT_OTHER): Payer: Self-pay | Admitting: Physical Therapy

## 2020-10-22 NOTE — Therapy (Signed)
[facility] [address] [place], [place], [address] Phone: [phone]   Fax:  [phone]  Physical Therapy Treatment  Patient Details  Name: Steven Mcguire MRN: [id number] Date of Birth: 12-16-2000 Referring Provider (PT): [place], [place], [place]   Encounter Date: 10/21/2020   PT End of Session - 10/21/20 1557    Visit Number 3    Number of Visits 7    Date for PT Re-Evaluation 11/16/20    Authorization Type BCBS    PT Start Time 1102    PT Stop Time 1157    PT Time Calculation (min) 55 min    Activity Tolerance Patient tolerated treatment well    Behavior During Therapy [facility] for tasks assessed/performed           Past Medical History:  Diagnosis Date  . Anxiety   . Anxiety, generalized   . Broken arm    right  . Depression   . Social anxiety disorder   . Suicidal ideation     Past Surgical History:  Procedure Laterality Date  . APPENDECTOMY     5th grade  . INGUINAL HERNIA REPAIR  2003  . WISDOM TOOTH EXTRACTION Bilateral 08/2019   all 4 wisdom teeth were extracted    There were no vitals filed for this visit.   Subjective Assessment - 10/21/20 1333    Subjective Patient is having medial knee pain today. Ie is not sure what he did. He woke up this morning with about 5/10 pain.    Limitations Standing;Walking;House hold activities;Lifting    How long can you sit comfortably? n/a    How long can you stand comfortably? ~1 hr ago    How long can you walk comfortably? ~15 to 30 minutes    Patient Stated Goals Improve motion and strength; return to strength training and running    Currently in Pain? Yes    Pain Score 5     Pain Location Knee    Pain Orientation Right    Pain Descriptors / Indicators Aching    Pain Type Chronic pain    Pain Onset 1 to 4 weeks ago    Pain Frequency Intermittent    Aggravating Factors  Standing and walking    Pain Relieving Factors ice    Multiple Pain Sites No                              [facility] Adult PT Treatment/Exercise - 10/22/20 0001      Knee/Hip Exercises: Standing   Other Standing Knee Exercises heel raise 2x15; TKE red 3x10; lateral band walk 3x10      Knee/Hip Exercises: Supine   Quad Sets Limitations 3x10 right    Bridges Limitations 3x10    Straight Leg Raises Limitations held 2nd to pain      Knee/Hip Exercises: Sidelying   Hip ABduction Strengthening;Left;2 sets;10 reps;Right      Modalities   Modalities Vasopneumatic      Vasopneumatic   Number Minutes Vasopneumatic  15 minutes    Vasopnuematic Location  Knee    Vasopneumatic Pressure Low    Vasopneumatic Temperature  32      Manual Therapy   Manual therapy comments used mcconel tape to reduce pressure on medial knee                  PT Education - 10/21/20 1340    Education Details reviewed standing  closed chain exercises and concept between closed chain.    Person(s) Educated Patient    Methods Explanation;Demonstration;Tactile cues;Verbal cues    Comprehension Verbalized understanding;Verbal cues required;Returned demonstration;Tactile cues required               PT Long Term Goals - 10/05/20 1247      PT LONG TERM GOAL #1   Title Pt will be independent with HEP    Time 6    Period Weeks    Status New    Target Date 11/16/20      PT LONG TERM GOAL #2   Title Pt will have R = L knee AROM    Baseline R knee 5-110 deg    Time 6    Period Weeks    Status New    Target Date 11/16/20      PT LONG TERM GOAL #3   Title Pt will be able to demo normal reciprocal gait pattern    Baseline antalgic, wide base, R foot abducted, R knee valgus    Time 6    Period Weeks    Status New    Target Date 11/16/20      PT LONG TERM GOAL #4   Title Pt will be able to perform 10 squats with <2/10 pain    Time 6    Period Weeks    Status New    Target Date 11/16/20      PT LONG TERM GOAL #5   Title Pt will understand self progression for  return to run and return to strength training protocol    Time 6    Period Weeks    Status New    Target Date 11/16/20                 Plan - 10/21/20 1558    Clinical Impression Statement Patient was somewhat limited by pain today. He was unable to do SLR without pain. He tolerated close chained exercises well. therapy updated HEP for standing exercises. her had no significant increase in pain. Therapy updated his HEP. Therapy trialed vaso for pain and swelling.    Personal Factors and Comorbidities Age;Past/Current Experience;Social Background;Time since onset of injury/illness/exacerbation    Examination-Activity Limitations Squat;Stairs;Stand;Locomotion Level;Transfers    Stability/Clinical Decision Making Stable/Uncomplicated    Clinical Decision Making Low    Rehab Potential Good    PT Frequency 1x / week    PT Duration 6 weeks    PT Treatment/Interventions ADLs/Self Care Home Management;Aquatic Therapy;Canalith Repostioning;Cryotherapy;Electrical Stimulation;Iontophoresis 4mg /ml Dexamethasone;Moist Heat;Ultrasound;Gait training;DME Instruction;Stair training;Functional mobility training;Therapeutic activities;Therapeutic exercise;Balance training;Neuromuscular re-education;Orthotic Fit/Training;Patient/family education;Manual techniques;Dry needling;Passive range of motion;Taping;Vasopneumatic Device    PT Next Visit Plan Assess response to HEP. Continue to strengthen R hip, quad, and hamstring. Continue to work on knee stability and gait training to decrease antalgic gait. Improve knee ROM    PT Home Exercise Plan Access Code: [email or web address]    Consulted and Agree with Plan of Care Patient           Patient will benefit from skilled therapeutic intervention in order to improve the following deficits and impairments:  Abnormal gait,Hypomobility,Increased edema,Decreased activity tolerance,Decreased strength,Increased fascial restricitons,Pain,Decreased balance,Decreased  mobility,Difficulty walking,Decreased range of motion,Improper body mechanics  Visit Diagnosis: Stiffness of right knee, not elsewhere classified  Chronic pain of right knee  Difficulty in walking, not elsewhere classified  Localized edema  Muscle weakness (generalized)     Problem List Patient Active Problem List   Diagnosis Date Noted  .  Suicidal ideation 08/10/2015  . OCD (obsessive compulsive disorder) 05/07/2015  . Neurotic depression 05/07/2015  . Major depressive disorder, recurrent, severe without psychotic features (HCC)   . MDD (major depressive disorder), recurrent episode, severe (HCC) 12/24/2014  . School avoidance 09/12/2014  . Abdominal pain in pediatric patient 07/22/2014  . Generalized anxiety disorder 10/05/2013  . Acne 10/05/2013  . Social anxiety disorder 05/06/2013  . Acute appendicitis 11/27/2011    Dessie Coma PT DPT  10/22/2020, 8:37 AM  [facility] [address] [place], [place], [address] Phone: [phone]   Fax:  [phone]  Name: Steven Mcguire MRN: [id number] Date of Birth: 05/30/2001

## 2020-10-26 ENCOUNTER — Ambulatory Visit (HOSPITAL_BASED_OUTPATIENT_CLINIC_OR_DEPARTMENT_OTHER): Payer: BC Managed Care – PPO | Attending: Physician Assistant | Admitting: Physical Therapy

## 2020-10-26 ENCOUNTER — Encounter (HOSPITAL_BASED_OUTPATIENT_CLINIC_OR_DEPARTMENT_OTHER): Payer: Self-pay | Admitting: Physical Therapy

## 2020-10-26 ENCOUNTER — Other Ambulatory Visit: Payer: Self-pay

## 2020-10-26 DIAGNOSIS — M25561 Pain in right knee: Secondary | ICD-10-CM | POA: Insufficient documentation

## 2020-10-26 DIAGNOSIS — M6281 Muscle weakness (generalized): Secondary | ICD-10-CM | POA: Insufficient documentation

## 2020-10-26 DIAGNOSIS — G8929 Other chronic pain: Secondary | ICD-10-CM | POA: Diagnosis present

## 2020-10-26 DIAGNOSIS — R600 Localized edema: Secondary | ICD-10-CM | POA: Insufficient documentation

## 2020-10-26 DIAGNOSIS — R262 Difficulty in walking, not elsewhere classified: Secondary | ICD-10-CM | POA: Insufficient documentation

## 2020-10-26 DIAGNOSIS — M25661 Stiffness of right knee, not elsewhere classified: Secondary | ICD-10-CM | POA: Insufficient documentation

## 2020-10-26 NOTE — Therapy (Signed)
[facility] [address] [place], [place], [address] Phone: [phone]   Fax:  [phone]  Physical Therapy Treatment  Patient Details  Name: Steven Mcguire MRN: [id number] Date of Birth: 05/06/2001 Referring Provider (PT): Dion [place], [place]   Encounter Date: 10/26/2020   PT End of Session - 10/26/20 1215    Visit Number 4    Number of Visits 7    Date for PT Re-Evaluation 11/16/20    Authorization Type BCBS    PT Start Time 1145    PT Stop Time 1227    PT Time Calculation (min) 42 min    Activity Tolerance Patient tolerated treatment well    Behavior During Therapy [facility] for tasks assessed/performed           Past Medical History:  Diagnosis Date  . Anxiety   . Anxiety, generalized   . Broken arm    right  . Depression   . Social anxiety disorder   . Suicidal ideation     Past Surgical History:  Procedure Laterality Date  . APPENDECTOMY     5th grade  . INGUINAL HERNIA REPAIR  2003  . WISDOM TOOTH EXTRACTION Bilateral 08/2019   all 4 wisdom teeth were extracted    There were no vitals filed for this visit.   Subjective Assessment - 10/26/20 1152    Subjective Patient is not having pain today. He reported improved pain after the last visit. He has been working on his exercises.    Limitations Standing;Walking;House hold activities;Lifting    How long can you sit comfortably? n/a    How long can you stand comfortably? ~1 hr ago    How long can you walk comfortably? ~15 to 30 minutes    Patient Stated Goals Improve motion and strength; return to strength training and running    Currently in Pain? No/denies                             [facility] Adult PT Treatment/Exercise - 10/26/20 0001      Knee/Hip Exercises: Aerobic   Nustep L4 5 min      Knee/Hip Exercises: Machines for Strengthening   Other Machine leg press 2 legs 50lbs 3x10      Knee/Hip Exercises: Standing   Heel Raises  Limitations x20 minor pain in the calf    Lateral Step Up Limitations 2x10 4 inch step    Forward Step Up Limitations 2x10 4 inch step      Knee/Hip Exercises: Supine   Short Arc Quad Sets Limitations attmepted but the patient had pain    Straight Leg Raises Limitations 3x10    Other Supine Knee/Hip Exercises supin clamshell 3x10      Manual Therapy   Manual therapy comments scar tissue friction massage to medial knee. Imporved pain with quad activation following. Showed patient how to do at home on her own.                  PT Education - 10/26/20 1153    Education Details reviewed HEp and symptom mangement    Person(s) Educated Patient    Methods Explanation;Demonstration;Tactile cues;Verbal cues    Comprehension Verbalized understanding;Returned demonstration;Verbal cues required;Tactile cues required               PT Long Term Goals - 10/05/20 1247      PT LONG TERM GOAL #1   Title Pt  will be independent with HEP    Time 6    Period Weeks    Status New    Target Date 11/16/20      PT LONG TERM GOAL #2   Title Pt will have R = L knee AROM    Baseline R knee 5-110 deg    Time 6    Period Weeks    Status New    Target Date 11/16/20      PT LONG TERM GOAL #3   Title Pt will be able to demo normal reciprocal gait pattern    Baseline antalgic, wide base, R foot abducted, R knee valgus    Time 6    Period Weeks    Status New    Target Date 11/16/20      PT LONG TERM GOAL #4   Title Pt will be able to perform 10 squats with <2/10 pain    Time 6    Period Weeks    Status New    Target Date 11/16/20      PT LONG TERM GOAL #5   Title Pt will understand self progression for return to run and return to strength training protocol    Time 6    Period Weeks    Status New    Target Date 11/16/20                 Plan - 10/26/20 1401    Clinical Impression Statement Patient had pain today during SAQ. He reports anytime he activley extends his leg  he has pain in his medial knee. Therapy palpated medial knee and there appears to be some scar tissue around the port. Therapy perfromed scar tissue massage on that area. He had improved ability to perfrom a SLR following scar tissue break-up. We adavnced the patients clised chain standing exercises today including step up/ lateral step up, and leg press. he had a mild increase in pain. he declined ice and reports he will ice at home.    Personal Factors and Comorbidities Age;Past/Current Experience;Social Background;Time since onset of injury/illness/exacerbation    Examination-Activity Limitations Squat;Stairs;Stand;Locomotion Level;Transfers    Examination-Participation Restrictions Cleaning;Community Activity;Driving;Occupation;School;Yard Work;Shop    Stability/Clinical Decision Making Stable/Uncomplicated    Clinical Decision Making Low    Rehab Potential Good    PT Frequency 1x / week    PT Duration 6 weeks    PT Treatment/Interventions ADLs/Self Care Home Management;Aquatic Therapy;Canalith Repostioning;Cryotherapy;Electrical Stimulation;Iontophoresis 4mg /ml Dexamethasone;Moist Heat;Ultrasound;Gait training;DME Instruction;Stair training;Functional mobility training;Therapeutic activities;Therapeutic exercise;Balance training;Neuromuscular re-education;Orthotic Fit/Training;Patient/family education;Manual techniques;Dry needling;Passive range of motion;Taping;Vasopneumatic Device    PT Next Visit Plan Assess response to HEP. Continue to strengthen R hip, quad, and hamstring. Continue to work on knee stability and gait training to decrease antalgic gait. Improve knee ROM    PT Home Exercise Plan Access Code: [email or web address]    Consulted and Agree with Plan of Care Patient           Patient will benefit from skilled therapeutic intervention in order to improve the following deficits and impairments:  Abnormal gait,Hypomobility,Increased edema,Decreased activity tolerance,Decreased strength,Increased  fascial restricitons,Pain,Decreased balance,Decreased mobility,Difficulty walking,Decreased range of motion,Improper body mechanics  Visit Diagnosis: Stiffness of right knee, not elsewhere classified  Chronic pain of right knee  Difficulty in walking, not elsewhere classified  Localized edema  Muscle weakness (generalized)     Problem List Patient Active Problem List   Diagnosis Date Noted  . Suicidal ideation 08/10/2015  . OCD (obsessive compulsive disorder) 05/07/2015  .  Neurotic depression 05/07/2015  . Major depressive disorder, recurrent, severe without psychotic features (HCC)   . MDD (major depressive disorder), recurrent episode, severe (HCC) 12/24/2014  . School avoidance 09/12/2014  . Abdominal pain in pediatric patient 07/22/2014  . Generalized anxiety disorder 10/05/2013  . Acne 10/05/2013  . Social anxiety disorder 05/06/2013  . Acute appendicitis 11/27/2011    Dessie Coma PT DPT  10/26/2020, 2:26 PM  [facility] [address] [place], [place], [address] Phone: [phone]   Fax:  [phone]  Name: Steven Mcguire MRN: [id number] Date of Birth: 04/22/2001

## 2020-11-02 ENCOUNTER — Ambulatory Visit (HOSPITAL_BASED_OUTPATIENT_CLINIC_OR_DEPARTMENT_OTHER): Payer: BC Managed Care – PPO | Admitting: Physical Therapy

## 2020-11-02 ENCOUNTER — Encounter (HOSPITAL_BASED_OUTPATIENT_CLINIC_OR_DEPARTMENT_OTHER): Payer: Self-pay | Admitting: Physical Therapy

## 2020-11-02 DIAGNOSIS — R600 Localized edema: Secondary | ICD-10-CM

## 2020-11-02 DIAGNOSIS — M25661 Stiffness of right knee, not elsewhere classified: Secondary | ICD-10-CM

## 2020-11-02 DIAGNOSIS — G8929 Other chronic pain: Secondary | ICD-10-CM

## 2020-11-02 DIAGNOSIS — M6281 Muscle weakness (generalized): Secondary | ICD-10-CM

## 2020-11-02 DIAGNOSIS — M25561 Pain in right knee: Secondary | ICD-10-CM

## 2020-11-02 DIAGNOSIS — R262 Difficulty in walking, not elsewhere classified: Secondary | ICD-10-CM

## 2020-11-02 NOTE — Therapy (Signed)
[facility] [address] [place], [place], [address] Phone: [phone]   Fax:  [phone]  Physical Therapy Treatment  Patient Details  Name: Steven Mcguire MRN: [id number] Date of Birth: 06/11/2001 Referring Provider (PT): Dion Saucier D, [place]   Encounter Date: 11/02/2020   PT End of Session - 11/02/20 1201    Visit Number 5    Date for PT Re-Evaluation 11/16/20    Authorization Type BCBS    PT Start Time 1145    PT Stop Time 1228    PT Time Calculation (min) 43 min    Activity Tolerance Patient tolerated treatment well    Behavior During Therapy [facility] for tasks assessed/performed           Past Medical History:  Diagnosis Date  . Anxiety   . Anxiety, generalized   . Broken arm    right  . Depression   . Social anxiety disorder   . Suicidal ideation     Past Surgical History:  Procedure Laterality Date  . APPENDECTOMY     5th grade  . INGUINAL HERNIA REPAIR  2003  . WISDOM TOOTH EXTRACTION Bilateral 08/2019   all 4 wisdom teeth were extracted    There were no vitals filed for this visit.   Subjective Assessment - 11/02/20 1151    Subjective Patient reports he is doing well. He hasn't had much pain. He has been working. He is noticing some hamstring pain in his high hamstring and behind the knee.    Limitations Standing;Walking;House hold activities;Lifting    How long can you sit comfortably? n/a    How long can you stand comfortably? ~1 hr ago    How long can you walk comfortably? ~15 to 30 minutes    Patient Stated Goals Improve motion and strength; return to strength training and running    Currently in Pain? No/denies                             [facility] Adult PT Treatment/Exercise - 11/02/20 0001      Knee/Hip Exercises: Stretches   Passive Hamstring Stretch Limitations reviewed hamstring strething and self soft tissue mobilization from supine and sitting.      Knee/Hip  Exercises: Aerobic   Stationary Bike L1 5 min      Knee/Hip Exercises: Standing   Other Standing Knee Exercises rebounder 3x10 2lb ball improved stability each set; smith machine single leg stance with bar 20lbs and bar 3x30 sec hold; kettle bell swing 3x10 sec hold.      Knee/Hip Exercises: Supine   Bridges Limitations x20    Straight Leg Raises Limitations 3x10 1kb      Manual Therapy   Manual therapy comments palapated trigger points in the hamstring and palpated scar tissue in the knee. The scar tissue appears to have improved,                  PT Education - 11/02/20 1152    Education Details hamstring stretching and soft tissue mobilization    Person(s) Educated Patient    Methods Explanation;Demonstration;Tactile cues;Verbal cues    Comprehension Verbalized understanding;Returned demonstration               PT Long Term Goals - 10/05/20 1247      PT LONG TERM GOAL #1   Title Pt will be independent with HEP    Time 6    Period Weeks  Status New    Target Date 11/16/20      PT LONG TERM GOAL #2   Title Pt will have R = L knee AROM    Baseline R knee 5-110 deg    Time 6    Period Weeks    Status New    Target Date 11/16/20      PT LONG TERM GOAL #3   Title Pt will be able to demo normal reciprocal gait pattern    Baseline antalgic, wide base, R foot abducted, R knee valgus    Time 6    Period Weeks    Status New    Target Date 11/16/20      PT LONG TERM GOAL #4   Title Pt will be able to perform 10 squats with <2/10 pain    Time 6    Period Weeks    Status New    Target Date 11/16/20      PT LONG TERM GOAL #5   Title Pt will understand self progression for return to run and return to strength training protocol    Time 6    Period Weeks    Status New    Target Date 11/16/20                 Plan - 11/02/20 1212    Clinical Impression Statement Patient is makig good progress. he continues to have mild instability with single leg  stance activity but it is improving. He is having some hamstring discmfort with walking. He has no MOI other then light walking. He had a trigger point distal and lateral int the lateral hamstring. Therapy reviewed stretching and self ISTYM and rolling of the hamstring. The area of scar tissue in his anterior knee appears to be improving. We worked on advncing his single leg stability exercises today. Therapy was also able to add kettle bell swing although he had minor pain with it. We will continue to progress him towards functional knee mobility.    Personal Factors and Comorbidities Age;Past/Current Experience;Social Background;Time since onset of injury/illness/exacerbation    Examination-Activity Limitations Squat;Stairs;Stand;Locomotion Level;Transfers    Examination-Participation Restrictions Cleaning;Community Activity;Driving;Occupation;School;Yard Work;Shop    Stability/Clinical Decision Making Stable/Uncomplicated    Clinical Decision Making Low    Rehab Potential Good    PT Frequency 1x / week    PT Duration 6 weeks    PT Treatment/Interventions ADLs/Self Care Home Management;Aquatic Therapy;Canalith Repostioning;Cryotherapy;Electrical Stimulation;Iontophoresis 4mg /ml Dexamethasone;Moist Heat;Ultrasound;Gait training;DME Instruction;Stair training;Functional mobility training;Therapeutic activities;Therapeutic exercise;Balance training;Neuromuscular re-education;Orthotic Fit/Training;Patient/family education;Manual techniques;Dry needling;Passive range of motion;Taping;Vasopneumatic Device    PT Next Visit Plan Assess response to HEP. Continue to strengthen R hip, quad, and hamstring. Continue to work on knee stability and gait training to decrease antalgic gait. Improve knee ROM    PT Home Exercise Plan Access Code: [email or web address]    Consulted and Agree with Plan of Care Patient           Patient will benefit from skilled therapeutic intervention in order to improve the following deficits  and impairments:  Abnormal gait,Hypomobility,Increased edema,Decreased activity tolerance,Decreased strength,Increased fascial restricitons,Pain,Decreased balance,Decreased mobility,Difficulty walking,Decreased range of motion,Improper body mechanics  Visit Diagnosis: Stiffness of right knee, not elsewhere classified  Chronic pain of right knee  Difficulty in walking, not elsewhere classified  Localized edema  Muscle weakness (generalized)     Problem List Patient Active Problem List   Diagnosis Date Noted  . Suicidal ideation 08/10/2015  . OCD (obsessive compulsive disorder) 05/07/2015  . Neurotic  depression 05/07/2015  . Major depressive disorder, recurrent, severe without psychotic features (HCC)   . MDD (major depressive disorder), recurrent episode, severe (HCC) 12/24/2014  . School avoidance 09/12/2014  . Abdominal pain in pediatric patient 07/22/2014  . Generalized anxiety disorder 10/05/2013  . Acne 10/05/2013  . Social anxiety disorder 05/06/2013  . Acute appendicitis 11/27/2011    Dessie Coma PT DPT  11/02/2020, 2:05 PM  [facility] [address] [place], [place], [address] Phone: [phone]   Fax:  [phone]  Name: Steven Mcguire MRN: [id number] Date of Birth: 07/06/2000

## 2020-11-09 ENCOUNTER — Encounter (HOSPITAL_BASED_OUTPATIENT_CLINIC_OR_DEPARTMENT_OTHER): Payer: Self-pay | Admitting: Physical Therapy

## 2020-11-09 ENCOUNTER — Ambulatory Visit (HOSPITAL_BASED_OUTPATIENT_CLINIC_OR_DEPARTMENT_OTHER): Payer: BC Managed Care – PPO | Admitting: Physical Therapy

## 2020-11-09 ENCOUNTER — Other Ambulatory Visit: Payer: Self-pay

## 2020-11-09 DIAGNOSIS — M6281 Muscle weakness (generalized): Secondary | ICD-10-CM

## 2020-11-09 DIAGNOSIS — M25661 Stiffness of right knee, not elsewhere classified: Secondary | ICD-10-CM

## 2020-11-09 DIAGNOSIS — R262 Difficulty in walking, not elsewhere classified: Secondary | ICD-10-CM

## 2020-11-09 DIAGNOSIS — M25561 Pain in right knee: Secondary | ICD-10-CM

## 2020-11-09 DIAGNOSIS — R600 Localized edema: Secondary | ICD-10-CM

## 2020-11-10 ENCOUNTER — Encounter (HOSPITAL_BASED_OUTPATIENT_CLINIC_OR_DEPARTMENT_OTHER): Payer: Self-pay | Admitting: Physical Therapy

## 2020-11-10 NOTE — Therapy (Signed)
[facility] [address] [place], [place], [address] Phone: [phone]   Fax:  [phone]  Physical Therapy Treatment  Patient Details  Name: Steven Mcguire MRN: [id number] Date of Birth: 08/23/2000 Referring Provider (PT): Dion Saucier D, [place]   Encounter Date: 11/09/2020   PT End of Session - 11/09/20 1155    Visit Number 6    Number of Visits 7    Date for PT Re-Evaluation 11/16/20    Authorization Type BCBS    PT Start Time 1149    PT Stop Time 1230    PT Time Calculation (min) 41 min    Activity Tolerance Patient tolerated treatment well    Behavior During Therapy [facility] for tasks assessed/performed           Past Medical History:  Diagnosis Date  . Anxiety   . Anxiety, generalized   . Broken arm    right  . Depression   . Social anxiety disorder   . Suicidal ideation     Past Surgical History:  Procedure Laterality Date  . APPENDECTOMY     5th grade  . INGUINAL HERNIA REPAIR  2003  . WISDOM TOOTH EXTRACTION Bilateral 08/2019   all 4 wisdom teeth were extracted    There were no vitals filed for this visit.   Subjective Assessment - 11/09/20 1151    Subjective Patient had to do a job on Saturday that involved moving some things then he had to work ontop of it.    Limitations Standing;Walking;House hold activities;Lifting    How long can you stand comfortably? ~1 hr ago    How long can you walk comfortably? ~15 to 30 minutes    Patient Stated Goals Improve motion and strength; return to strength training and running    Currently in Pain? Yes    Pain Score 4     Pain Location Knee    Pain Orientation Right    Pain Descriptors / Indicators Aching    Pain Type Surgical pain    Pain Onset 1 to 4 weeks ago    Pain Frequency Intermittent    Aggravating Factors  standing and walking    Pain Relieving Factors ice    Multiple Pain Sites No                             [facility] Adult PT  Treatment/Exercise - 11/10/20 0001      Knee/Hip Exercises: Aerobic   Stationary Bike L1 5 min      Knee/Hip Exercises: Standing   Other Standing Knee Exercises reviewed squat technique 2x10 1x on smith machine; 1x on freeweight; no pain dead lift 65 lbs 2x10; kettle bell 2x10;      Knee/Hip Exercises: Supine   Bridges Limitations x20    Straight Leg Raises Limitations 3x10 1lb      Manual Therapy   Manual therapy comments IATYM to hamstring                  PT Education - 11/09/20 1154    Education Details reviewed exercises and stretching    Person(s) Educated Patient    Methods Explanation;Demonstration;Tactile cues;Verbal cues    Comprehension Verbalized understanding;Returned demonstration;Verbal cues required;Tactile cues required               PT Long Term Goals - 10/05/20 1247      PT LONG TERM GOAL #1   Title Pt  will be independent with HEP    Time 6    Period Weeks    Status New    Target Date 11/16/20      PT LONG TERM GOAL #2   Title Pt will have R = L knee AROM    Baseline R knee 5-110 deg    Time 6    Period Weeks    Status New    Target Date 11/16/20      PT LONG TERM GOAL #3   Title Pt will be able to demo normal reciprocal gait pattern    Baseline antalgic, wide base, R foot abducted, R knee valgus    Time 6    Period Weeks    Status New    Target Date 11/16/20      PT LONG TERM GOAL #4   Title Pt will be able to perform 10 squats with <2/10 pain    Time 6    Period Weeks    Status New    Target Date 11/16/20      PT LONG TERM GOAL #5   Title Pt will understand self progression for return to run and return to strength training protocol    Time 6    Period Weeks    Status New    Target Date 11/16/20                 Plan - 11/09/20 1201    Clinical Impression Statement Depsite reports of baseline pain the patient tolerated treatment well. he continues to havepain in th eback of his knee but it is improving. He has  been having some pain in his hamstring and into the hamstring tendon areas in the back of the knee. He tolerated stading exercises well. Therapy reviewed strengthtneing exercises.    Personal Factors and Comorbidities Age;Past/Current Experience;Social Background;Time since onset of injury/illness/exacerbation    Examination-Activity Limitations Squat;Stairs;Stand;Locomotion Level;Transfers    Examination-Participation Restrictions Cleaning;Community Activity;Driving;Occupation;School;Yard Work;Shop    Stability/Clinical Decision Making Stable/Uncomplicated    Clinical Decision Making Low    Rehab Potential Good    PT Frequency 1x / week    PT Duration 6 weeks    PT Treatment/Interventions ADLs/Self Care Home Management;Aquatic Therapy;Canalith Repostioning;Cryotherapy;Electrical Stimulation;Iontophoresis 4mg /ml Dexamethasone;Moist Heat;Ultrasound;Gait training;DME Instruction;Stair training;Functional mobility training;Therapeutic activities;Therapeutic exercise;Balance training;Neuromuscular re-education;Orthotic Fit/Training;Patient/family education;Manual techniques;Dry needling;Passive range of motion;Taping;Vasopneumatic Device    PT Next Visit Plan Assess response to HEP. Continue to strengthen R hip, quad, and hamstring. Continue to work on knee stability and gait training to decrease antalgic gait. Improve knee ROM    PT Home Exercise Plan Access Code: [email or web address]    Consulted and Agree with Plan of Care Patient           Patient will benefit from skilled therapeutic intervention in order to improve the following deficits and impairments:  Abnormal gait,Hypomobility,Increased edema,Decreased activity tolerance,Decreased strength,Increased fascial restricitons,Pain,Decreased balance,Decreased mobility,Difficulty walking,Decreased range of motion,Improper body mechanics  Visit Diagnosis: Stiffness of right knee, not elsewhere classified  Chronic pain of right knee  Difficulty in walking,  not elsewhere classified  Localized edema  Muscle weakness (generalized)     Problem List Patient Active Problem List   Diagnosis Date Noted  . Suicidal ideation 08/10/2015  . OCD (obsessive compulsive disorder) 05/07/2015  . Neurotic depression 05/07/2015  . Major depressive disorder, recurrent, severe without psychotic features (HCC)   . MDD (major depressive disorder), recurrent episode, severe (HCC) 12/24/2014  . School avoidance 09/12/2014  . Abdominal pain in pediatric  patient 07/22/2014  . Generalized anxiety disorder 10/05/2013  . Acne 10/05/2013  . Social anxiety disorder 05/06/2013  . Acute appendicitis 11/27/2011    Dessie Coma PT DPT  11/10/2020, 1:01 PM  [facility] [address] [place], [place], [address] Phone: [phone]   Fax:  [phone]  Name: Steven Mcguire MRN: [id number] Date of Birth: 12/23/2000

## 2020-11-16 ENCOUNTER — Encounter (HOSPITAL_BASED_OUTPATIENT_CLINIC_OR_DEPARTMENT_OTHER): Payer: Self-pay | Admitting: Physical Therapy

## 2020-11-16 ENCOUNTER — Ambulatory Visit (HOSPITAL_BASED_OUTPATIENT_CLINIC_OR_DEPARTMENT_OTHER): Payer: BC Managed Care – PPO | Admitting: Physical Therapy

## 2020-11-16 ENCOUNTER — Other Ambulatory Visit: Payer: Self-pay

## 2020-11-16 DIAGNOSIS — R262 Difficulty in walking, not elsewhere classified: Secondary | ICD-10-CM

## 2020-11-16 DIAGNOSIS — G8929 Other chronic pain: Secondary | ICD-10-CM

## 2020-11-16 DIAGNOSIS — M25661 Stiffness of right knee, not elsewhere classified: Secondary | ICD-10-CM

## 2020-11-16 DIAGNOSIS — R600 Localized edema: Secondary | ICD-10-CM

## 2020-11-16 NOTE — Therapy (Signed)
[facility] [address] [place], [place], [address] Phone: [phone]   Fax:  [phone]  Physical Therapy Treatment/Progress Note   Patient Details  Name: Steven Mcguire MRN: [id number] Date of Birth: [birth year] Referring Provider (PT): Dion Saucier D, [place]   Encounter Date: 11/16/2020   PT End of Session - 11/16/20 1206    Visit Number 7    Number of Visits 13    Date for PT Re-Evaluation 12/28/20    Authorization Type BCBS    PT Start Time 1145    PT Stop Time 1225    PT Time Calculation (min) 40 min    Activity Tolerance Patient tolerated treatment well    Behavior During Therapy [facility] for tasks assessed/performed           Past Medical History:  Diagnosis Date  . Anxiety   . Anxiety, generalized   . Broken arm    right  . Depression   . Social anxiety disorder   . Suicidal ideation     Past Surgical History:  Procedure Laterality Date  . APPENDECTOMY     5th grade  . INGUINAL HERNIA REPAIR  2003  . WISDOM TOOTH EXTRACTION Bilateral 08/2019   all 4 wisdom teeth were extracted    There were no vitals filed for this visit.   Subjective Assessment - 11/16/20 1151    Subjective Patient has been doing more walking. Overall he feels like it has been better. He is still having some tightness in the back of the knee. He got a little sore after the last visit but not too bad. He feels like he is progressing well.    Limitations Standing;Walking;House hold activities;Lifting    How long can you sit comfortably? n/a    How long can you stand comfortably? ~1 hr ago    How long can you walk comfortably? ~15 to 30 minutes    Patient Stated Goals Improve motion and strength; return to strength training and running    Currently in Pain? Yes    Pain Score 4               [facility] PT Assessment - 11/16/20 0001      Assessment   Medical Diagnosis Z47.89 (ICD-10-CM) - Encounter for other orthopedic aftercare     Referring Provider (PT) McClung, Fuller Plan D, PA      Single Leg Stance   Comments significant improvement in single leg stance      Posture/Postural Control   Posture Comments improved right knee stability with squats.      AROM   Right Knee Extension 0    Right Knee Flexion 120      Strength   Right Hip Flexion 5/5    Right Hip Extension 5/5    Right Hip External Rotation  5/5    Right Hip ABduction 5/5    Right Knee Flexion 5/5    Right Knee Extension 5/5                         [facility] Adult PT Treatment/Exercise - 11/16/20 0001      Knee/Hip Exercises: Machines for Strengthening   Other Machine able walk 15 lbs 5x fwd and back; 5x side to side facing each direction      Knee/Hip Exercises: Standing   Other Standing Knee Exercises lateral band walk blue x20; monster walk x20 bilateral; eccentric heel touch x20 right; squat 75lbs 3x10; dead  lift 2x15 3lbs from raised step                       PT Long Term Goals - 10/05/20 1247      PT LONG TERM GOAL #1   Title Pt will be independent with HEP    Time 6    Period Weeks    Status New    Target Date 11/16/20      PT LONG TERM GOAL #2   Title Pt will have R = L knee AROM    Baseline R knee 5-110 deg    Time 6    Period Weeks    Status New    Target Date 11/16/20      PT LONG TERM GOAL #3   Title Pt will be able to demo normal reciprocal gait pattern    Baseline antalgic, wide base, R foot abducted, R knee valgus    Time 6    Period Weeks    Status New    Target Date 11/16/20      PT LONG TERM GOAL #4   Title Pt will be able to perform 10 squats with <2/10 pain    Time 6    Period Weeks    Status New    Target Date 11/16/20      PT LONG TERM GOAL #5   Title Pt will understand self progression for return to run and return to strength training protocol    Time 6    Period Weeks    Status New    Target Date 11/16/20                 Plan - 11/16/20 1207    Clinical  Impression Statement Patient is making good progress. He had no pain with ther-ex. He continues to have discomofort and tightness at times in the back of his knee. He feels like he is able to standlonger and do more before having symptoms. He would benefit from further skilled theray 1x every 2 weeks for 6 mroe weeks. Therapy upgraded band for lateral band walk and monster walk to blue. Therapy also reviewed backwards cable walks and lateral cable walks. Therapy will follow up in 2 weeks. With potential D/C pending progress. We will    Personal Factors and Comorbidities Age;Past/Current Experience;Social Background;Time since onset of injury/illness/exacerbation    Examination-Activity Limitations Squat;Stairs;Stand;Locomotion Level;Transfers    Examination-Participation Restrictions Cleaning;Community Activity;Driving;Occupation;School;Yard Work;Shop    Stability/Clinical Decision Making Stable/Uncomplicated    Clinical Decision Making Low    Rehab Potential Good    PT Frequency 1x / week    PT Duration 6 weeks    PT Treatment/Interventions ADLs/Self Care Home Management;Aquatic Therapy;Canalith Repostioning;Cryotherapy;Electrical Stimulation;Iontophoresis 4mg /ml Dexamethasone;Moist Heat;Ultrasound;Gait training;DME Instruction;Stair training;Functional mobility training;Therapeutic activities;Therapeutic exercise;Balance training;Neuromuscular re-education;Orthotic Fit/Training;Patient/family education;Manual techniques;Dry needling;Passive range of motion;Taping;Vasopneumatic Device    PT Next Visit Plan Assess response to HEP. Continue to strengthen R hip, quad, and hamstring. Continue to work on knee stability and gait training to decrease antalgic gait. Improve knee ROM    PT Home Exercise Plan Access Code: [email or web address]    Consulted and Agree with Plan of Care Patient           Patient will benefit from skilled therapeutic intervention in order to improve the following deficits and impairments:   Abnormal gait,Hypomobility,Increased edema,Decreased activity tolerance,Decreased strength,Increased fascial restricitons,Pain,Decreased balance,Decreased mobility,Difficulty walking,Decreased range of motion,Improper body mechanics  Visit Diagnosis: Stiffness of right knee, not elsewhere classified -  Plan: PT plan of care cert/re-cert  Chronic pain of right knee - Plan: PT plan of care cert/re-cert  Difficulty in walking, not elsewhere classified - Plan: PT plan of care cert/re-cert  Localized edema - Plan: PT plan of care cert/re-cert     Problem List Patient Active Problem List   Diagnosis Date Noted  . Suicidal ideation 08/10/2015  . OCD (obsessive compulsive disorder) 05/07/2015  . Neurotic depression 05/07/2015  . Major depressive disorder, recurrent, severe without psychotic features (HCC)   . MDD (major depressive disorder), recurrent episode, severe (HCC) 12/24/2014  . School avoidance 09/12/2014  . Abdominal pain in pediatric patient 07/22/2014  . Generalized anxiety disorder 10/05/2013  . Acne 10/05/2013  . Social anxiety disorder 05/06/2013  . Acute appendicitis 11/27/2011    Dessie Coma 11/16/2020, 2:03 PM  [facility] [address] [place], [place], [address] Phone: [phone]   Fax:  [phone]  Name: Chelsea Nusz MRN: [id number] Date of Birth: 10/28/2000

## 2020-12-02 ENCOUNTER — Encounter (HOSPITAL_BASED_OUTPATIENT_CLINIC_OR_DEPARTMENT_OTHER): Payer: Self-pay | Admitting: Physical Therapy

## 2020-12-02 ENCOUNTER — Other Ambulatory Visit: Payer: Self-pay

## 2020-12-02 ENCOUNTER — Ambulatory Visit (HOSPITAL_BASED_OUTPATIENT_CLINIC_OR_DEPARTMENT_OTHER): Payer: BC Managed Care – PPO | Attending: Physician Assistant | Admitting: Physical Therapy

## 2020-12-02 DIAGNOSIS — R600 Localized edema: Secondary | ICD-10-CM

## 2020-12-02 DIAGNOSIS — R262 Difficulty in walking, not elsewhere classified: Secondary | ICD-10-CM | POA: Diagnosis present

## 2020-12-02 DIAGNOSIS — M25661 Stiffness of right knee, not elsewhere classified: Secondary | ICD-10-CM

## 2020-12-02 DIAGNOSIS — M25561 Pain in right knee: Secondary | ICD-10-CM | POA: Diagnosis present

## 2020-12-02 DIAGNOSIS — G8929 Other chronic pain: Secondary | ICD-10-CM

## 2020-12-02 DIAGNOSIS — M6281 Muscle weakness (generalized): Secondary | ICD-10-CM

## 2020-12-03 ENCOUNTER — Encounter (HOSPITAL_BASED_OUTPATIENT_CLINIC_OR_DEPARTMENT_OTHER): Payer: Self-pay | Admitting: Physical Therapy

## 2020-12-03 NOTE — Therapy (Addendum)
[place], [place], [address] Phone: [phone]   Fax:  [phone]  Physical Therapy Treatment/Discharge   Patient Details  Name: Steven Mcguire MRN: [id number] Date of Birth: 06/19/2001 Referring Provider (PT): Jonelle Sidle D, [place]   Encounter Date: 12/02/2020   PT End of Session - 12/03/20 7408     Visit Number 8    Number of Visits 13    Date for PT Re-Evaluation 12/28/20    Authorization Type BCBS    PT Start Time 1448    PT Stop Time 1225    PT Time Calculation (min) 40 min    Activity Tolerance Patient tolerated treatment well    Behavior During Therapy [facility] for tasks assessed/performed             Past Medical History:  Diagnosis Date   Anxiety    Anxiety, generalized    Broken arm    right   Depression    Social anxiety disorder    Suicidal ideation     Past Surgical History:  Procedure Laterality Date   APPENDECTOMY     5th grade   INGUINAL HERNIA REPAIR  2003   WISDOM TOOTH EXTRACTION Bilateral 08/2019   all 4 wisdom teeth were extracted    There were no vitals filed for this visit.   Subjective Assessment - 12/03/20 0818     Subjective Patient reports some dyas it does very well. He ambualted 25 miles the other day without pain. Two days ago he did some lifting and had some pain. He reports it just comes and goes. The pain is moslty in the medial portion of the knee. He continues to work on his HEP.    Limitations Standing;Walking;House hold activities;Lifting    How long can you sit comfortably? n/a    How long can you stand comfortably? ~1 hr ago    Patient Stated Goals Improve motion and strength; return to strength training and running    Currently in Pain? No/denies   had pain yesterday               [facility] PT Assessment - 12/03/20 0001       Assessment   Medical Diagnosis Z47.89 (ICD-10-CM) - Encounter for other orthopedic aftercare    Referring  Provider (PT) Jonelle Sidle D, PA      Strength   Right Hip Flexion 5/5    Right Hip Extension 5/5    Right Hip External Rotation  5/5    Right Hip ABduction 5/5    Right Knee Flexion 5/5    Right Knee Extension 5/5                           [facility] Adult PT Treatment/Exercise - 12/03/20 0001       Knee/Hip Exercises: Aerobic   Stationary Bike L1 5 min      Knee/Hip Exercises: Standing   Other Standing Knee Exercises lateral band walk blue x20; monster walk x20 bilateral; eccentric heel touch x20 right; squat 75lbs 3x10; dead lift 2x15 3lbs from raised step; signle leg kettle bell reach; cone touch 2x10;      Knee/Hip Exercises: Seated   Sit to Sand 2 sets;10 reps;without UE support      Knee/Hip Exercises: Supine   Quad Sets Limitations 3x10    Bridges Limitations x20    Straight Leg Raises Limitations 3x10 1lb    Other Supine Knee/Hip  Exercises supin clamshell x20 blue                    PT Education - 12/03/20 0820     Education Details reviewed return to running exercises and final HEP    Person(s) Educated Patient    Methods Explanation;Demonstration;Tactile cues;Verbal cues    Comprehension Verbalized understanding;Returned demonstration;Verbal cues required;Tactile cues required                 PT Long Term Goals - 12/03/20 0842       PT LONG TERM GOAL #1   Title Pt will be independent with HEP    Baseline has final HEP    Time 6    Period Weeks    Status Achieved      PT LONG TERM GOAL #2   Title Pt will have R = L knee AROM    Baseline full range with minor pain at end range    Time 6    Period Weeks    Status Achieved      PT LONG TERM GOAL #3   Title Pt will be able to demo normal reciprocal gait pattern    Baseline normal    Time 6    Period Weeks    Status Achieved      PT LONG TERM GOAL #4   Title Pt will be able to perform 10 squats with <2/10 pain    Baseline perfroming squats with weight    Time 6     Period Weeks    Status Achieved      PT LONG TERM GOAL #5   Title Pt will understand self progression for return to run and return to strength training protocol    Baseline understands the progression. has returned to lifting but not to running    Time 6    Period Weeks    Status Achieved                   Plan - 12/03/20 8841     Clinical Impression Statement Patient continues to show some single leg instability and pain when his kene comes forward. He is not comfortable running yet, but he has begun increasing his walking distance. He has been going to the gym and dowing some lifting with his legs. His form is excellent with squats and dead lifts. He was adviseed to progres his weights slowly and listen to his knee. He was advised when he starts running to start with short intervals. He will continue with his HEP on his onw. He may come back for 1 more follow up before he goes away to school.    Personal Factors and Comorbidities Age;Past/Current Experience;Social Background;Time since onset of injury/illness/exacerbation    Examination-Activity Limitations Squat;Stairs;Stand;Locomotion Level;Transfers    Examination-Participation Restrictions Cleaning;Community Activity;Driving;Occupation;School;Yard Work;Shop    Stability/Clinical Decision Making Stable/Uncomplicated    Clinical Decision Making Low    Rehab Potential Good    PT Frequency 1x / week    PT Duration 6 weeks    PT Treatment/Interventions ADLs/Self Care Home Management;Aquatic Therapy;Canalith Repostioning;Cryotherapy;Electrical Stimulation;Iontophoresis 34m/ml Dexamethasone;Moist Heat;Ultrasound;Gait training;DME Instruction;Stair training;Functional mobility training;Therapeutic activities;Therapeutic exercise;Balance training;Neuromuscular re-education;Orthotic Fit/Training;Patient/family education;Manual techniques;Dry needling;Passive range of motion;Taping;Vasopneumatic Device    PT Next Visit Plan Assess  response to HEP. Continue to strengthen R hip, quad, and hamstring. Continue to work on knee stability and gait training to decrease antalgic gait. Improve knee ROM    PT Home Exercise Plan Access Code:  [email or web address]    Consulted and Agree with Plan of Care Patient             Patient will benefit from skilled therapeutic intervention in order to improve the following deficits and impairments:  Abnormal gait, Hypomobility, Increased edema, Decreased activity tolerance, Decreased strength, Increased fascial restricitons, Pain, Decreased balance, Decreased mobility, Difficulty walking, Decreased range of motion, Improper body mechanics  Visit Diagnosis: Stiffness of right knee, not elsewhere classified  Chronic pain of right knee  Difficulty in walking, not elsewhere classified  Localized edema  Muscle weakness (generalized)  PHYSICAL THERAPY DISCHARGE SUMMARY  Visits from Start of Care: 8  Current functional level related to goals / functional outcomes: Improved pain    Remaining deficits: Mild pain at times    Education / Equipment: HEP   Patient agrees to discharge. Patient goals were met. Patient is being discharged due to meeting the stated rehab goals.    Problem List Patient Active Problem List   Diagnosis Date Noted   Suicidal ideation 08/10/2015   OCD (obsessive compulsive disorder) 05/07/2015   Neurotic depression 05/07/2015   Major depressive disorder, recurrent, severe without psychotic features ([place])    MDD (major depressive disorder), recurrent episode, severe ([place]) 12/24/2014   School avoidance 09/12/2014   Abdominal pain in pediatric patient 07/22/2014   Generalized anxiety disorder 10/05/2013   Acne 10/05/2013   Social anxiety disorder 05/06/2013   Acute appendicitis 11/27/2011    Carney Living PT DPT  12/03/2020, 9:12 AM  [facility] [place], [place], [address] Phone: [phone]    Fax:  [phone]  Name: Steven Mcguire MRN: [id number] Date of Birth: [birth year]
